# Patient Record
Sex: Female | Born: 1985 | Race: White | Hispanic: No | Marital: Single | Smoking: Current every day smoker
Health system: Southern US, Community
[De-identification: ages and names within clinical notes are randomized; demographics above are authoritative.]

## PROBLEM LIST (undated history)

## (undated) DIAGNOSIS — Z5189 Encounter for other specified aftercare: Secondary | ICD-10-CM

## (undated) DIAGNOSIS — J189 Pneumonia, unspecified organism: Secondary | ICD-10-CM

## (undated) DIAGNOSIS — IMO0001 Reserved for inherently not codable concepts without codable children: Secondary | ICD-10-CM

## (undated) DIAGNOSIS — R7303 Prediabetes: Secondary | ICD-10-CM

## (undated) DIAGNOSIS — K760 Fatty (change of) liver, not elsewhere classified: Secondary | ICD-10-CM

## (undated) DIAGNOSIS — E7889 Other lipoprotein metabolism disorders: Secondary | ICD-10-CM

## (undated) DIAGNOSIS — D649 Anemia, unspecified: Secondary | ICD-10-CM

## (undated) DIAGNOSIS — R740 Nonspecific elevation of levels of transaminase and lactic acid dehydrogenase [LDH]: Secondary | ICD-10-CM

## (undated) DIAGNOSIS — F909 Attention-deficit hyperactivity disorder, unspecified type: Secondary | ICD-10-CM

## (undated) HISTORY — DX: Pneumonia, unspecified organism: J18.9

## (undated) HISTORY — DX: Anemia, unspecified: D64.9

## (undated) HISTORY — DX: Reserved for inherently not codable concepts without codable children: IMO0001

## (undated) HISTORY — DX: Prediabetes: R73.03

## (undated) HISTORY — DX: Other lipoprotein metabolism disorders: E78.89

## (undated) HISTORY — DX: Nonspecific elevation of levels of transaminase and lactic acid dehydrogenase (ldh): R74.0

## (undated) HISTORY — DX: Encounter for other specified aftercare: Z51.89

## (undated) HISTORY — DX: Fatty (change of) liver, not elsewhere classified: K76.0

## (undated) HISTORY — DX: Attention-deficit hyperactivity disorder, unspecified type: F90.9

---

## 2008-08-11 ENCOUNTER — Ambulatory Visit: Payer: Self-pay | Admitting: Family Medicine

## 2008-12-06 ENCOUNTER — Ambulatory Visit: Payer: Self-pay | Admitting: Family Medicine

## 2008-12-19 ENCOUNTER — Ambulatory Visit: Payer: Self-pay | Admitting: Family Medicine

## 2011-05-04 ENCOUNTER — Inpatient Hospital Stay (HOSPITAL_COMMUNITY)
Admission: EM | Admit: 2011-05-04 | Discharge: 2011-05-06 | DRG: 760 | Disposition: A | Discharge status: Home / Self Care | Payer: Self-pay | Attending: Family Medicine | Admitting: Family Medicine

## 2011-05-04 DIAGNOSIS — D62 Acute posthemorrhagic anemia: Secondary | ICD-10-CM | POA: Diagnosis present

## 2011-05-04 DIAGNOSIS — Y9210 Unspecified residential institution as the place of occurrence of the external cause: Secondary | ICD-10-CM | POA: Diagnosis not present

## 2011-05-04 DIAGNOSIS — L408 Other psoriasis: Secondary | ICD-10-CM | POA: Diagnosis present

## 2011-05-04 DIAGNOSIS — J45909 Unspecified asthma, uncomplicated: Secondary | ICD-10-CM | POA: Diagnosis present

## 2011-05-04 DIAGNOSIS — N926 Irregular menstruation, unspecified: Secondary | ICD-10-CM | POA: Diagnosis present

## 2011-05-04 DIAGNOSIS — Y849 Medical procedure, unspecified as the cause of abnormal reaction of the patient, or of later complication, without mention of misadventure at the time of the procedure: Secondary | ICD-10-CM | POA: Diagnosis not present

## 2011-05-04 DIAGNOSIS — F17200 Nicotine dependence, unspecified, uncomplicated: Secondary | ICD-10-CM | POA: Diagnosis present

## 2011-05-04 DIAGNOSIS — N920 Excessive and frequent menstruation with regular cycle: Principal | ICD-10-CM | POA: Diagnosis present

## 2011-05-04 DIAGNOSIS — N921 Excessive and frequent menstruation with irregular cycle: Secondary | ICD-10-CM

## 2011-05-04 DIAGNOSIS — K259 Gastric ulcer, unspecified as acute or chronic, without hemorrhage or perforation: Secondary | ICD-10-CM | POA: Diagnosis present

## 2011-05-04 DIAGNOSIS — R5084 Febrile nonhemolytic transfusion reaction: Secondary | ICD-10-CM | POA: Diagnosis not present

## 2011-05-04 LAB — WET PREP, GENITAL
Clue Cells Wet Prep HPF POC: NONE SEEN
Trich, Wet Prep: NONE SEEN

## 2011-05-04 LAB — DIFFERENTIAL
Basophils Absolute: 0 10*3/uL (ref 0.0–0.1)
Eosinophils Absolute: 0.2 10*3/uL (ref 0.0–0.7)
Eosinophils Relative: 1 % (ref 0–5)
Lymphocytes Relative: 15 % (ref 12–46)
Neutrophils Relative %: 79 % — ABNORMAL HIGH (ref 43–77)

## 2011-05-04 LAB — POCT PREGNANCY, URINE: Preg Test, Ur: NEGATIVE

## 2011-05-04 LAB — BASIC METABOLIC PANEL
GFR calc non Af Amer: >60 mL/min (ref >60)
Potassium: 3.3 mEq/L — ABNORMAL LOW (ref 3.5–5.1)
Sodium: 139 mEq/L (ref 135–145)

## 2011-05-04 LAB — CBC
HCT: 16.5 % — ABNORMAL LOW (ref 36.0–46.0)
Platelets: 270 10*3/uL (ref 150–400)
RBC: 2.54 MIL/uL — ABNORMAL LOW (ref 3.87–5.11)
RDW: 17.7 % — ABNORMAL HIGH (ref 11.5–15.5)
WBC: 11.8 10*3/uL — ABNORMAL HIGH (ref 4.0–10.5)

## 2011-05-04 LAB — URINALYSIS, ROUTINE W REFLEX MICROSCOPIC
Ketones, ur: NEGATIVE mg/dL
Leukocytes, UA: NEGATIVE
Nitrite: NEGATIVE
pH: 5.5 (ref 5.0–8.0)

## 2011-05-04 LAB — URINE MICROSCOPIC-ADD ON

## 2011-05-04 LAB — ABO/RH: ABO/RH(D): A NEG

## 2011-05-05 ENCOUNTER — Inpatient Hospital Stay (HOSPITAL_COMMUNITY): Payer: Self-pay

## 2011-05-05 LAB — URINALYSIS, MICROSCOPIC ONLY
Glucose, UA: NEGATIVE mg/dL
Protein, ur: 100 mg/dL — AB
Specific Gravity, Urine: 1.011 (ref 1.005–1.030)
pH: 6 (ref 5.0–8.0)

## 2011-05-05 LAB — CBC
MCH: 23.8 pg — ABNORMAL LOW (ref 26.0–34.0)
MCH: 25.5 pg — ABNORMAL LOW (ref 26.0–34.0)
MCHC: 32.8 g/dL (ref 30.0–36.0)
MCHC: 33.9 g/dL (ref 30.0–36.0)
MCV: 72.5 fL — ABNORMAL LOW (ref 78.0–100.0)
MCV: 75.2 fL — ABNORMAL LOW (ref 78.0–100.0)
Platelets: 260 10*3/uL (ref 150–400)
Platelets: 273 10*3/uL (ref 150–400)
RDW: 23.2 % — ABNORMAL HIGH (ref 11.5–15.5)

## 2011-05-05 LAB — BASIC METABOLIC PANEL
BUN: 5 mg/dL — ABNORMAL LOW (ref 6–23)
Creatinine, Ser: 0.68 mg/dL (ref 0.4–1.2)
GFR calc non Af Amer: >60 mL/min (ref >60)
Glucose, Bld: 168 mg/dL — ABNORMAL HIGH (ref 70–99)

## 2011-05-05 LAB — PROLACTIN: Prolactin: 8.8 ng/mL

## 2011-05-06 ENCOUNTER — Inpatient Hospital Stay (HOSPITAL_COMMUNITY): Payer: Self-pay

## 2011-05-06 LAB — CROSSMATCH
Unit division: 0
Unit division: 0

## 2011-05-07 LAB — TRANSFUSION REACTION: Post RXN DAT IgG: NEGATIVE

## 2011-05-07 LAB — GC/CHLAMYDIA PROBE AMP, GENITAL: GC Probe Amp, Genital: NEGATIVE

## 2011-05-20 NOTE — Discharge Summary (Signed)
Ana [name], Ana [name]               ACCOUNT NO.:  [id number]  MEDICAL RECORD NO.:  [id number]           PATIENT TYPE:  I  LOCATION:  5121                         FACILITY:  [facility]  PHYSICIAN:  Emila Steinhauser A. [name], M.D.    DATE OF BIRTH:  [birth year]  DATE OF ADMISSION:  05/04/2011 DATE OF DISCHARGE:  05/06/2011                              DISCHARGE SUMMARY   PRIMARY CARE PROVIDER:  Gentry Fitz.  DISCHARGE DIAGNOSES: 1. Metromenorrhagia. 2. Blood loss anemia. 3. Irregular menstrual periods. 4. Gastric ulcer. 5. Psoriasis.  DISCHARGE MEDICATIONS: 1. Iron sulfate 35 mg 1 tablet by mouth three times a day. 2. Ibuprofen 800 mg 1 tablet by mouth every 8 hours as needed for     pain. 3. Provera 10 mg 1 tablet by mouth daily for the next 8 days. 4. Multivitamins 1 tablet by mouth daily.  CONSULTS:  None.  PROCEDURES:  Transvaginal ultrasound:  Mildly prominent endometrium with internal homogeneity.  This may indicate hyperplasia, neoplasia, or less likely polyps or submucosal fibroid.  This was while the patient was on progesterone.  Repeat imaging after cessation withdrawal bleeding may be helpful to assess for interval change and to better evaluate the endometrium.  LABORATORY DATA: 1. CBC on admission:  WBC 11.8, hemoglobin 4.9, hematocrit 16.5,     platelets 270. 2. BMET on admission:  Sodium 139, potassium 3.3, chloride 106, CO2     44, BUN 8, creatinine 0.69, glucose 135. 3. Urine pregnancy negative. 4. Gonorrhea and Chlamydia pending. 5. Wet prep negative. 6. UA negative. 7. Prolactin level 8.8. 8. TSH 0.911. 9. CBC on May 05, 2011:  WBC 19.1, hemoglobin 7.6, hematocrit 22.4,     platelets 273.  BRIEF HOSPITAL COURSE:  The patient is a 25 year old female admitted for profuse vaginal bleeding and found to have anemia. 1. Metromenorrhagia:  The patient has a history of irregular menstrual     cycles.  The patient was having heavy vaginal bleeding for 3-4     days.  Prior  to that, she had not had a real period in 2-3 years.     The patient had a transvaginal ultrasound to assess the source of     the bleeding and this showed a mildly prominent endometrium likely     due to hyperplasia, although neoplasm was also listed on the     differential.  This recommended repeat ultrasound after cessation     of withdrawal bleeding.  The patient was started on IV estrogen x1     and also on Provera for 10 days.  After admission, the patient's     vaginal bleeding had stopped the day after admission.  At the time     of discharge, the patient had not had any vaginal bleeding for 24     hours. 2. Anemia:  This was due to blood loss anemia from her heavy vaginal     bleeding.  On admission, her hemoglobin was 4.9.  The patient did     receive 3 units of packed red blood cells.  The patient had a     transfusion reaction with  a fever, but she did receive the     transfusions.  After the blood, her hemoglobin responded     appropriately and was 7.9 prior to discharge.  The patient was also     started on oral iron which she will continue.  The patient should     continue the Provera for completion of 10-day course.  The patient     likely has too much estrogen and could benefit from cyclic     progesterone but we will defer this to primary care provider.  FOLLOWUP:  The patient is to follow up with Dr. Edmonia James at [facility], phone number [phone].  The patient is to call and schedule an appointment for next week.  DISCHARGE INSTRUCTIONS:  The patient is to increase her activity slowly. The patient is to have a low-sodium, heart-healthy diet.  The patient should be monitored for the next 48 hours.  DISCHARGE CONDITION:  Stable.     Ana Sole, MD   ______________________________ Ana [name]. [name], M.D.    WS/MEDQ  D:  05/06/2011  T:  05/06/2011  Job:  [id number]  Electronically Signed by Ana Sole MD on 05/14/2011 02:16:43  PM Electronically Signed by Zachery Dauer M.D. on 05/20/2011 07:21:20 AM

## 2011-05-20 NOTE — H&P (Signed)
[name], [name]               ACCOUNT NO.:  [id number]  MEDICAL RECORD NO.:  [id number]           PATIENT TYPE:  I  LOCATION:  5121                         FACILITY:  [facility]  PHYSICIAN:  Jaeson Molstad A. [name], M.D.    DATE OF BIRTH:  06-08-1986  DATE OF ADMISSION:  05/04/2011 DATE OF DISCHARGE:                             HISTORY & PHYSICAL   PRIMARY CARE PHYSICIAN:  Unassigned.  CHIEF COMPLAINT:  Vaginal bleeding, anemia, and syncope.  The patient is a 25 year old female who presents with 3-4 days of vaginal bleeding and one episode of syncope today.  The patient states that she has not had a period in 2-3 years, only occasional spotting every 2-3 months that lasts for 1 day.  The patient again had bleeding and no abdominal cramping since Wednesday, large clots per the patient, had to use greater than 30 tampons secondary to heavy bleeding today. The patient also got in hot shower and had syncopal episodes.  The patient's partner assisted that she come to the emergency department for evaluation.  On arrival, hemoglobin found to be 4.9.  The patient has a long history of anemia.  ALLERGIES:  No known drug allergies.  MEDICATIONS:  On iron supplement b.i.d. x2 days.  PAST MEDICAL HISTORY: 1. Gastric ulcer. 2. Irregular menstrual periods. 3. Psoriasis. 4. Anemia.  PAST SURGICAL HISTORY:  None.  SOCIAL HISTORY:  The patient lives with partner.  She works as an Educational psychologist.  She smokes 1-2 cigarettes per day x6 years.  She drinks occasional alcohol and denies drug use.  FAMILY HISTORY:  Mother has thyroid problems.  Father DVT.  Siblings ADD.  REVIEW OF SYSTEMS:  Positive for chills, positive decrease in appetite, positive fatigue, positive headache, positive chest pain, positive dyspnea, positive bleeding, positive dizziness.  No fevers, no sweats, no sore throat, no edema, no wheezing, no cough, no nausea, no vomiting, no diarrhea, no dysuria, no rash, no myalgias, no  polyuria.  PHYSICAL EXAM:  VITAL SIGNS:  Temperature 99.9, pulse 114, respiratory rate 26, blood pressure 126/58, pulse ox 100% on room air. GENERAL:  In no acute distress, resting in bed. HEENT:  Pale complexion.  Mucous membranes moist.  Normocephalic, atraumatic. CARDIOVASCULAR:  Regular rate and rhythm.  No murmurs, rubs, or gallops. LUNGS:  Clear to auscultation.  Bilateral good air movement. ABDOMEN:  Soft and nontender.  No rebound, no guarding.  Positive tenderness in the lower abdomen. GU:  Exam by ER MD, please see note.  External exam normal.  Continued vaginal bleeding. EXTREMITIES:  No lower extremity edema.  Hands are little bit pale in color.  Cap refill less than 3 seconds. NEURO:  Alert and oriented x3.  Following all commands.  Cranial nerves II-XII grossly intact. MSK:  Moving all four extremities.  LABORATORIES AND STUDIES:  EKG showed normal sinus tach.  UA showed trace hemoglobin.  Yeast, Trichomonas, clue cells negative.  Urine pregnancy negative.  Sodium 149, potassium 2.3.  White blood cells 11.8, hemoglobin 4.9, MCV 65.  PROBLEMS: 1. Metromenorrhagia, hemoglobin 4.9, originally ordered total of 4     packed red blood cell  units, but secondary to fever from blood     transfusion, I have spoken with MD for blood bank, and he     recommends only 1 more unit for now with post monitoring of the     patient.  The patient given Benadryl and Solu-Medrol for further     prevention of allergic response.  Gave the patient IV estrogen.     Birth control pill in house.  Vicodin given for abdominal cramping. 2. Anemia.  The patient states she has a chronic history of anemia,     now worsened with acute loss.  We will continue iron supplement.     Hemoglobin currently 4.9.  We will recheck after unit #2 has been     administered. 3. Abdominal pain, minimal lower abdominal pain present times 3-4     days.  Vicodin p.r.n.  Urine pregnancy test negative.  No point      tenderness. 4. Disposition, pending clinical improvement.     Ellin Mayhew, MD   ______________________________ [name]. [name], M.D.    DC/MEDQ  D:  05/05/2011  T:  05/05/2011  Job:  [id number]  Electronically Signed by Ellin Mayhew  on 05/13/2011 10:01:42 AM Electronically Signed by Zachery Dauer M.D. on 05/20/2011 07:20:36 AM

## 2011-11-26 ENCOUNTER — Emergency Department (HOSPITAL_COMMUNITY): Payer: Self-pay

## 2011-11-26 ENCOUNTER — Emergency Department (HOSPITAL_COMMUNITY)
Admission: EM | Admit: 2011-11-26 | Discharge: 2011-11-26 | Disposition: A | Discharge status: Home / Self Care | Payer: Self-pay | Attending: Emergency Medicine | Admitting: Emergency Medicine

## 2011-11-26 DIAGNOSIS — R51 Headache: Secondary | ICD-10-CM | POA: Insufficient documentation

## 2011-11-26 DIAGNOSIS — N920 Excessive and frequent menstruation with regular cycle: Secondary | ICD-10-CM | POA: Insufficient documentation

## 2011-11-26 DIAGNOSIS — N898 Other specified noninflammatory disorders of vagina: Secondary | ICD-10-CM | POA: Insufficient documentation

## 2011-11-26 DIAGNOSIS — R42 Dizziness and giddiness: Secondary | ICD-10-CM | POA: Insufficient documentation

## 2011-11-26 DIAGNOSIS — R109 Unspecified abdominal pain: Secondary | ICD-10-CM | POA: Insufficient documentation

## 2011-11-26 DIAGNOSIS — N926 Irregular menstruation, unspecified: Secondary | ICD-10-CM | POA: Insufficient documentation

## 2011-11-26 DIAGNOSIS — N921 Excessive and frequent menstruation with irregular cycle: Secondary | ICD-10-CM

## 2011-11-26 LAB — POCT I-STAT, CHEM 8
HCT: 39 % (ref 36.0–46.0)
Hemoglobin: 13.3 g/dL (ref 12.0–15.0)
Potassium: 4.3 mEq/L (ref 3.5–5.1)
Sodium: 144 mEq/L (ref 135–145)

## 2011-11-26 LAB — DIFFERENTIAL
Basophils Absolute: 0 10*3/uL (ref 0.0–0.1)
Basophils Relative: 0 % (ref 0–1)
Eosinophils Absolute: 0.1 10*3/uL (ref 0.0–0.7)
Neutrophils Relative %: 70 % (ref 43–77)

## 2011-11-26 LAB — WET PREP, GENITAL
Clue Cells Wet Prep HPF POC: NONE SEEN
Trich, Wet Prep: NONE SEEN

## 2011-11-26 LAB — URINALYSIS, ROUTINE W REFLEX MICROSCOPIC
Bilirubin Urine: NEGATIVE
Specific Gravity, Urine: 1.017 (ref 1.005–1.030)
Urobilinogen, UA: 0.2 mg/dL (ref 0.0–1.0)

## 2011-11-26 LAB — CBC
MCH: 27.7 pg (ref 26.0–34.0)
MCHC: 34.5 g/dL (ref 30.0–36.0)
Platelets: 260 10*3/uL (ref 150–400)
RBC: 4.65 MIL/uL (ref 3.87–5.11)
RDW: 13.6 % (ref 11.5–15.5)

## 2011-11-26 LAB — URINE MICROSCOPIC-ADD ON

## 2011-11-26 MED ORDER — MEDROXYPROGESTERONE ACETATE 5 MG PO TABS: 10.0000 mg | ORAL_TABLET | Freq: Every day | ORAL | Status: DC

## 2011-11-26 MED ORDER — ONDANSETRON HCL 4 MG/2ML IJ SOLN
4.0000 mg | Freq: Once | INTRAMUSCULAR | Status: AC
  Administered 2011-11-26: 4 mg via INTRAVENOUS
  Filled 2011-11-26: qty 2

## 2011-11-26 MED ORDER — IBUPROFEN 600 MG PO TABS: 600.0000 mg | ORAL_TABLET | Freq: Four times a day (QID) | ORAL | Status: AC | PRN

## 2011-11-26 MED ORDER — MEDROXYPROGESTERONE ACETATE 5 MG PO TABS: 5.0000 mg | ORAL_TABLET | Freq: Every day | ORAL | Status: DC

## 2011-11-26 MED ORDER — SODIUM CHLORIDE 0.9 % IV BOLUS (SEPSIS)
1000.0000 mL | Freq: Once | INTRAVENOUS | Status: AC
  Administered 2011-11-26 (×2): 1000 mL via INTRAVENOUS

## 2011-11-26 MED ORDER — MORPHINE SULFATE 4 MG/ML IJ SOLN
4.0000 mg | Freq: Once | INTRAMUSCULAR | Status: AC
  Administered 2011-11-26: 4 mg via INTRAVENOUS
  Filled 2011-11-26: qty 1

## 2011-11-26 NOTE — ED Provider Notes (Signed)
History    this is a 25 year old female presenting to the ED with chief complaints of lower abd pain and abnormal vaginal bleeding. For the past 4 days patient has been having increasing abdominal pain and vaginal bleeding. Initially she noticed one pad equivalent of vaginal bleeding without clots on the first day. Bleeding continue to persist and increase for the next several days. Yesterday she has more than 10 pads of vaginal bleeding with large amount of clots. She describes the lower abd pain is sharp, worsening with movement or bending over.  She also complaining of headache, lightheadedness and dizziness. She denies chest pain, shortness of breath, dysuria, vaginal discharge, rash. She has at home we'll menstruation and does not recall when was the last menstrual period. She is not sexually active. She has had similar pain in the past and was found to be anemic, which require blood transfusion.  CSN: [id number] Arrival date & time: 11/26/2011  8:19 AM   First MD Initiated Contact with Patient 11/26/11 0930      Chief Complaint  Patient presents with  . Vaginal Bleeding    (Consider location/radiation/quality/duration/timing/severity/associated sxs/prior treatment) HPI  History reviewed. No pertinent past medical history.  History reviewed. No pertinent past surgical history.  No family history on file.  History  Substance Use Topics  . Smoking status: Current Everyday Smoker  . Smokeless tobacco: Not on file  . Alcohol Use: No    OB History    Grav Para Term Preterm Abortions TAB SAB Ect Mult Living                  Review of Systems  All other systems reviewed and are negative.    Allergies  Review of patient's allergies indicates no known allergies.  Home Medications   Current Outpatient Rx  Name Route Sig Dispense Refill  . FERROUS SULFATE 325 (65 FE) MG PO TABS Oral Take 325 mg by mouth 2 (two) times daily.      . IBUPROFEN 200 MG PO TABS Oral Take 600 mg  by mouth 2 (two) times daily as needed. For pain       BP 134/72  Pulse 115  Temp(Src) 98.9 F (37.2 C) (Oral)  Resp 16  Ht 5\' 6"  (1.676 m)  Wt 210 lb (95.255 kg)  BMI 33.89 kg/m2  SpO2 98%  LMP 11/22/2011  Physical Exam  Nursing note and vitals reviewed. Constitutional: She appears well-developed and well-nourished. No distress.  HENT:  Head: Normocephalic and atraumatic.  Eyes: Conjunctivae are normal.  Neck: Normal range of motion. Neck supple.  Cardiovascular: Normal rate and regular rhythm.   Pulmonary/Chest: Effort normal and breath sounds normal. She exhibits no tenderness.  Abdominal: Soft. There is no tenderness. Hernia confirmed negative in the right inguinal area and confirmed negative in the left inguinal area.  Genitourinary: Uterus normal. There is no rash or lesion on the right labia. There is no rash or lesion on the left labia. Uterus is not tender. Cervix exhibits no motion tenderness and no discharge. Right adnexum displays no mass and no tenderness. Left adnexum displays no mass and no tenderness. There is bleeding around the vagina. No erythema or tenderness around the vagina. No vaginal discharge found.  Lymphadenopathy:       Right: No inguinal adenopathy present.       Left: No inguinal adenopathy present.    ED Course  Procedures (including critical care time)  Labs Reviewed - No data to display No results  found.   No diagnosis found.    MDM  Lower abnormal pain with vaginal bleeding, suggestive of ovarian cyst.  1:00 PM Lab reveals no evidence of anemia, uti, or other signs of infection. Electrolytes within normal limits. Ultrasounds revealed no acute finding. Her pain has improved. Patient request for progesterin to help decrease bleeding and pain. Patient state it has helped her in the past. She does not have an OB/GYN. I recommend for her to follow up with [facility]. Understanding and agreed with plan. I do not suspect other infection  or appendicitis as a source of problems.      Fayrene Helper, PA 11/26/11 [phone]

## 2011-11-26 NOTE — ED Notes (Signed)
Vaginal bleeding began on Friday.  Pt. Reports it being heavy and having pain

## 2011-11-26 NOTE — ED Notes (Signed)
Transported to u/s

## 2011-11-26 NOTE — ED Notes (Signed)
Pt  States she has profuse vaginal bleeding that started yesterday.  Has used a whole box of tampon since last night.   C/o " a little dizzy" associated with her abdominal pain.

## 2011-11-27 LAB — GC/CHLAMYDIA PROBE AMP, GENITAL
Chlamydia, DNA Probe: NEGATIVE
GC Probe Amp, Genital: NEGATIVE

## 2011-11-29 NOTE — ED Provider Notes (Signed)
Evaluation and management procedures were performed by the PA/NP under my supervision/collaboration.    Felisa Bonier, MD 11/29/11 [phone]

## 2012-07-06 ENCOUNTER — Ambulatory Visit (INDEPENDENT_AMBULATORY_CARE_PROVIDER_SITE_OTHER): Payer: BC Managed Care – PPO | Admitting: Obstetrics & Gynecology

## 2012-07-06 ENCOUNTER — Encounter: Payer: Self-pay | Admitting: Obstetrics & Gynecology

## 2012-07-06 VITALS — BP 123/76 | HR 88 | Temp 98.4°F | Resp 12 | Ht 66.0 in | Wt 229.0 lb

## 2012-07-06 DIAGNOSIS — E282 Polycystic ovarian syndrome: Secondary | ICD-10-CM

## 2012-07-06 DIAGNOSIS — N920 Excessive and frequent menstruation with regular cycle: Secondary | ICD-10-CM

## 2012-07-06 DIAGNOSIS — Z01419 Encounter for gynecological examination (general) (routine) without abnormal findings: Secondary | ICD-10-CM

## 2012-07-06 LAB — HEMOGLOBIN A1C
Hgb A1c MFr Bld: 6 % — ABNORMAL HIGH (ref <5.7)
Mean Plasma Glucose: 126 mg/dL — ABNORMAL HIGH (ref <117)

## 2012-07-06 LAB — CBC
Hemoglobin: 11.6 g/dL — ABNORMAL LOW (ref 12.0–15.0)
MCHC: 32.1 g/dL (ref 30.0–36.0)
Platelets: 313 10*3/uL (ref 150–400)
RBC: 5.41 MIL/uL — ABNORMAL HIGH (ref 3.87–5.11)

## 2012-07-06 LAB — LIPID PANEL: LDL Cholesterol: 76 mg/dL (ref 0–99)

## 2012-07-06 MED ORDER — MEDROXYPROGESTERONE ACETATE 10 MG PO TABS: 10.0000 mg | ORAL_TABLET | Freq: Every day | ORAL | Status: DC

## 2012-07-06 NOTE — Patient Instructions (Signed)
Polycystic Ovarian Syndrome Polycystic ovarian syndrome is a condition with a number of problems. One problem is with the ovaries. The ovaries are organs located in the female pelvis, on each side of the uterus. Usually, during the menstrual cycle, an egg is released from 1 ovary every month. This is called ovulation. When the egg is fertilized, it goes into the womb (uterus), which allows for the growth of a baby. The egg travels from the ovary through the fallopian tube to the uterus. The ovaries also make the hormones estrogen and progesterone. These hormones help the development of a woman's breasts, body shape, and body hair. They also regulate the menstrual cycle and pregnancy. Sometimes, cysts form in the ovaries. A cyst is a fluid-filled sac. On the ovary, different types of cysts can form. The most common type of ovarian cyst is called a functional or ovulation cyst. It is normal, and often forms during the normal menstrual cycle. Each month, a woman's ovaries grow tiny cysts that hold the eggs. When an egg is fully grown, the sac breaks open. This releases the egg. Then, the sac which released the egg from the ovary dissolves. In one type of functional cyst, called a follicle cyst, the sac does not break open to release the egg. It may actually continue to grow. This type of cyst usually disappears within 1 to 3 months.  One type of cyst problem with the ovaries is called Polycystic Ovarian Syndrome (PCOS). In this condition, many follicle cysts form, but do not rupture and produce an egg. This health problem can affect the following:  Menstrual cycle.   Heart.   Obesity.   Cancer of the uterus.   Fertility.   Blood vessels.   Hair growth (face and body) or baldness.   Hormones.   Appearance.   High blood pressure.   Stroke.   Insulin production.   Inflammation of the liver.   Elevated blood cholesterol and triglycerides.  CAUSES   No one knows the exact cause of PCOS.    Women with PCOS often have a mother or sister with PCOS. There is not yet enough proof to say this is inherited.   Many women with PCOS have a weight problem.   Researchers are looking at the relationship between PCOS and the body's ability to make insulin. Insulin is a hormone that regulates the change of sugar, starches, and other food into energy for the body's use, or for storage. Some women with PCOS make too much insulin. It is possible that the ovaries react by making too many female hormones, called androgens. This can lead to acne, excessive hair growth, weight gain, and ovulation problems.   Too much production of luteinizing hormone (LH) from the pituitary gland in the brain stimulates the ovary to produce too much female hormone (androgen).  SYMPTOMS   Infrequent or no menstrual periods, and/or irregular bleeding.   Inability to get pregnant (infertility), because of not ovulating.   Increased growth of hair on the face, chest, stomach, back, thumbs, thighs, or toes.   Acne, oily skin, or dandruff.   Pelvic pain.   Weight gain or obesity, usually carrying extra weight around the waist.   Type 2 diabetes (this is the diabetes that usually does not need insulin).   High cholesterol.   High blood pressure.   Female-pattern baldness or thinning hair.   Patches of thickened and dark brown or black skin on the neck, arms, breasts, or thighs.   Skin tags,   or tiny excess flaps of skin, in the armpits or neck area.   Sleep apnea (excessive snoring and breathing stops at times while asleep).   Deepening of the voice.   Gestational diabetes when pregnant.   Increased risk of miscarriage with pregnancy.  DIAGNOSIS  There is no single test to diagnose PCOS.   Your caregiver will:   Take a medical history.   Perform a pelvic exam.   Perform an ultrasound.   Check your female and female hormone levels.   Measure glucose or sugar levels in the blood.   Do other blood  tests.   If you are producing too many female hormones, your caregiver will make sure it is from PCOS. At the physical exam, your caregiver will want to evaluate the areas of increased hair growth. Try to allow natural hair growth for a few days before the visit.   During a pelvic exam, the ovaries may be enlarged or swollen by the increased number of small cysts. This can be seen more easily by vaginal ultrasound or screening, to examine the ovaries and lining of the uterus (endometrium) for cysts. The uterine lining may become thicker, if there has not been a regular period.  TREATMENT  Because there is no cure for PCOS, it needs to be managed to prevent problems. Treatments are based on your symptoms. Treatment is also based on whether you want to have a baby or whether you need contraception.  Treatment may include:  Progesterone hormone, to start a menstrual period.   Birth control pills, to make you have regular menstrual periods.   Medicines to make you ovulate, if you want to get pregnant.   Medicines to control your insulin.   Medicine to control your blood pressure.   Medicine and diet, to control your high cholesterol and triglycerides in your blood.   Surgery, making small holes in the ovary, to decrease the amount of female hormone production. This is done through a long, lighted tube (laparoscope), placed into the pelvis through a tiny incision in the lower abdomen.  Your caregiver will go over some of the choices with you. WOMEN WITH PCOS HAVE THESE CHARACTERISTICS:  High levels of female hormones called androgens.   An irregular or no menstrual cycle.   May have many small cysts in their ovaries.  PCOS is the most common hormonal reproductive problem in women of childbearing age. WHY DO WOMEN WITH PCOS HAVE TROUBLE WITH THEIR MENSTRUAL CYCLE? Each month, about 20 eggs start to mature in the ovaries. As one egg grows and matures, the follicle breaks open to release the egg,  so it can travel through the fallopian tube for fertilization. When the single egg leaves the follicle, ovulation takes place. In women with PCOS, the ovary does not make all of the hormones it needs for any of the eggs to fully mature. They may start to grow and accumulate fluid, but no one egg becomes large enough. Instead, some may remain as cysts. Since no egg matures or is released, ovulation does not occur and the hormone progesterone is not made. Without progesterone, a woman's menstrual cycle is irregular or absent. Also, the cysts produce female hormones, which continue to prevent ovulation.  Document Released: 03/21/2005 Document Revised: 11/14/2011 Document Reviewed: 10/13/2009 [facility] Patient Information 2012 [place], [place].Levonorgestrel intrauterine device (IUD) What is this medicine? LEVONORGESTREL IUD (LEE voe nor jes trel) is a contraceptive (birth control) device. It is used to prevent pregnancy and to treat heavy bleeding  that occurs during your period. It can be used for up to 5 years. This medicine may be used for other purposes; ask your health care provider or pharmacist if you have questions. What should I tell my health care provider before I take this medicine? They need to know if you have any of these conditions: -abnormal Pap smear -cancer of the breast, uterus, or cervix -diabetes -endometritis -genital or pelvic infection now or in the past -have more than one sexual partner or your partner has more than one partner -heart disease -history of an ectopic or tubal pregnancy -immune system problems -IUD in place -liver disease or tumor -problems with blood clots or take blood-thinners -use intravenous drugs -uterus of unusual shape -vaginal bleeding that has not been explained -an unusual or allergic reaction to levonorgestrel, other hormones, silicone, or polyethylene, medicines, foods, dyes, or preservatives -pregnant or trying to get  pregnant -breast-feeding How should I use this medicine? This device is placed inside the uterus by a health care professional. Talk to your pediatrician regarding the use of this medicine in children. Special care may be needed. Overdosage: If you think you have taken too much of this medicine contact a poison control center or emergency room at once. NOTE: This medicine is only for you. Do not share this medicine with others. What if I miss a dose? This does not apply. What may interact with this medicine? Do not take this medicine with any of the following medications: -amprenavir -bosentan -fosamprenavir This medicine may also interact with the following medications: -aprepitant -barbiturate medicines for inducing sleep or treating seizures -bexarotene -griseofulvin -medicines to treat seizures like carbamazepine, ethotoin, felbamate, oxcarbazepine, phenytoin, topiramate -modafinil -pioglitazone -rifabutin -rifampin -rifapentine -some medicines to treat HIV infection like atazanavir, indinavir, lopinavir, nelfinavir, tipranavir, ritonavir -St. John's wort -warfarin This list may not describe all possible interactions. Give your health care provider a list of all the medicines, herbs, non-prescription drugs, or dietary supplements you use. Also tell them if you smoke, drink alcohol, or use illegal drugs. Some items may interact with your medicine. What should I watch for while using this medicine? Visit your doctor or health care professional for regular check ups. See your doctor if you or your partner has sexual contact with others, becomes HIV positive, or gets a sexual transmitted disease. This product does not protect you against HIV infection (AIDS) or other sexually transmitted diseases. You can check the placement of the IUD yourself by reaching up to the top of your vagina with clean fingers to feel the threads. Do not pull on the threads. It is a good habit to check  placement after each menstrual period. Call your doctor right away if you feel more of the IUD than just the threads or if you cannot feel the threads at all. The IUD may come out by itself. You may become pregnant if the device comes out. If you notice that the IUD has come out use a backup birth control method like condoms and call your health care provider. Using tampons will not change the position of the IUD and are okay to use during your period. What side effects may I notice from receiving this medicine? Side effects that you should report to your doctor or health care professional as soon as possible: -allergic reactions like skin rash, itching or hives, swelling of the face, lips, or tongue -fever, flu-like symptoms -genital sores -high blood pressure -no menstrual period for 6 weeks during use -pain, swelling,  warmth in the leg -pelvic pain or tenderness -severe or sudden headache -signs of pregnancy -stomach cramping -sudden shortness of breath -trouble with balance, talking, or walking -unusual vaginal bleeding, discharge -yellowing of the eyes or skin Side effects that usually do not require medical attention (report to your doctor or health care professional if they continue or are bothersome): -acne -breast pain -change in sex drive or performance -changes in weight -cramping, dizziness, or faintness while the device is being inserted -headache -irregular menstrual bleeding within first 3 to 6 months of use -nausea This list may not describe all possible side effects. Call your doctor for medical advice about side effects. You may report side effects to FDA at 1-800-FDA-1088. Where should I keep my medicine? This does not apply. NOTE: This sheet is a summary. It may not cover all possible information. If you have questions about this medicine, talk to your doctor, pharmacist, or health care provider.  2012, [employer]. (12/16/2008 6:39:08 PM)

## 2012-07-06 NOTE — Progress Notes (Signed)
Patient ID: Ana [name], female   DOB: 1986/05/22, 26 y.o.   MRN: [id number]  Patient states she usually has 2 periods per calendar year. She has been hospitalized or had to be evaluated in the emergency department for each period last year. Has always had irregular, heavy periods with clots. Periods last longer than a week. Was on OCPs at a younger age, but cannot tolerate OCPs due to severe pain. Was given provera in May and November of 2012 for a 10 day course each time which stopped the bleeding for a 6 month time period.

## 2012-07-06 NOTE — Progress Notes (Signed)
Patient ID: Ana [name], female   DOB: [birth year], 26 y.o.   MRN: [id number]  Chief Complaint  Patient presents with  . Menometrorrhagia    HPI Ana [name] is a 26 y.o. female.  G0 HPI Pt with long time h/o irreg cycles.  Never had ovulatory cycles.  Had 2 episodes of very heavy bleeding 1 requiring transfusion.  Pt frustrated with lack of sx relief.   Past Medical History  Diagnosis Date  . Anemia   . Blood transfusion     May 2012    History reviewed. No pertinent past surgical history.  Family History  Problem Relation Age of Onset  . Hypertension Paternal Aunt   . Diabetes Maternal Grandmother     Social History History  Substance Use Topics  . Smoking status: Current Everyday Smoker -- 0.2 packs/day for 3 years    Types: Cigarettes  . Smokeless tobacco: Not on file  . Alcohol Use: No    No Known Allergies  Current Outpatient Prescriptions  Medication Sig Dispense Refill  . ferrous sulfate 325 (65 FE) MG tablet Take 325 mg by mouth 2 (two) times daily.        Marland Kitchen ibuprofen (ADVIL,MOTRIN) 200 MG tablet Take 600 mg by mouth 2 (two) times daily as needed. For pain       . DISCONTD: medroxyPROGESTERone (PROVERA) 5 MG tablet Take 2 tablets (10 mg total) by mouth daily.  20 tablet  0    Review of Systems Review of Systems  Blood pressure 123/76, pulse 88, temperature 98.4 F (36.9 C), temperature source Oral, resp. rate 12, height 5\' 6"  (1.676 m), weight 229 lb (103.874 kg), last menstrual period 06/29/2012.  Physical Exam Physical Exam Lungs: CTA CV: RRR Abd: Obese, NT, ND GU: EGBUS: no lesions Vagina: no blood in vault Cervix: no lesion; no mucopurulent d/c Uterus: small, mobile Adnexa: no masses;NT     Assessment PCOS- reviewed ds and risks Annual- never had PAP Menorrhagia- d/w pt tx options including in OCP's, Provera and Mirena-  Reviewed side efftect of Mirena      Plan    F/u PAP Labs: Lipid panel, HbA1c, CBC F/u for  [employer] Harraway-Smith, M.D., [facility], Brinna Divelbiss 07/06/2012, 3:51 PM

## 2012-07-15 ENCOUNTER — Encounter: Payer: Self-pay | Admitting: Obstetrics & Gynecology

## 2012-07-15 ENCOUNTER — Ambulatory Visit (INDEPENDENT_AMBULATORY_CARE_PROVIDER_SITE_OTHER): Payer: BC Managed Care – PPO | Admitting: Obstetrics & Gynecology

## 2012-07-15 VITALS — BP 112/72 | HR 81 | Temp 98.4°F | Ht 65.0 in | Wt 227.0 lb

## 2012-07-15 DIAGNOSIS — R7303 Prediabetes: Secondary | ICD-10-CM

## 2012-07-15 DIAGNOSIS — R7309 Other abnormal glucose: Secondary | ICD-10-CM

## 2012-07-15 DIAGNOSIS — N920 Excessive and frequent menstruation with regular cycle: Secondary | ICD-10-CM

## 2012-07-15 DIAGNOSIS — Z30430 Encounter for insertion of intrauterine contraceptive device: Secondary | ICD-10-CM

## 2012-07-15 LAB — POCT PREGNANCY, URINE: Preg Test, Ur: NEGATIVE

## 2012-07-15 MED ORDER — METFORMIN HCL 1000 MG PO TABS: 1000.0000 mg | ORAL_TABLET | Freq: Two times a day (BID) | ORAL | Status: DC

## 2012-07-15 MED ORDER — LEVONORGESTREL 20 MCG/24HR IU IUD: 1.0000 | INTRAUTERINE_SYSTEM | Freq: Once | INTRAUTERINE | Status: DC

## 2012-07-15 NOTE — Patient Instructions (Signed)
Metformin tablets What is this medicine? METFORMIN (met FOR min) is used to treat type 2 diabetes. It helps to control blood sugar. Treatment is combined with diet and exercise. This medicine can be used alone or with other medicines for diabetes. This medicine may be used for other purposes; ask your health care provider or pharmacist if you have questions. What should I tell my health care provider before I take this medicine? They need to know if you have any of these conditions: -anemia -frequently drink alcohol-containing beverages -become easily dehydrated -heart attack -heart failure that is treated with medications -kidney disease -liver disease -polycystic ovary syndrome -serious infection or injury -vomiting -an unusual or allergic reaction to metformin, other medicines, foods, dyes, or preservatives -pregnant or trying to get pregnant -breast-feeding How should I use this medicine? Take this medicine by mouth. Take it with meals. Swallow the tablets with a drink of water. Follow the directions on the prescription label. Take your medicine at regular intervals. Do not take your medicine more often than directed. Talk to your pediatrician regarding the use of this medicine in children. While this drug may be prescribed for children as young as 16 years of age for selected conditions, precautions do apply. Overdosage: If you think you have taken too much of this medicine contact a poison control center or emergency room at once. NOTE: This medicine is only for you. Do not share this medicine with others. What if I miss a dose? If you miss a dose, take it as soon as you can. If it is almost time for your next dose, take only that dose. Do not take double or extra doses. What may interact with this medicine? Do not take this medicine with any of the following medications: -dofetilide -gatifloxacin -certain contrast medicines given before X-rays, CT scans, MRI, or other  procedures This medicine may also interact with the following medications: -digoxin -diuretics -female hormones, like estrogens or progestins and birth control pills -isoniazid -medicines for blood pressure, heart disease, irregular heart beat -morphine -nicotinic acid -phenothiazines like chlorpromazine, mesoridazine, prochlorperazine, thioridazine -phenytoin -procainamide -quinidine -quinine -ranitidine -steroid medicines like prednisone or cortisone -stimulant medicines for attention disorders, weight loss, or to stay awake -thyroid medicines -trimethoprim -vancomycin This list may not describe all possible interactions. Give your health care provider a list of all the medicines, herbs, non-prescription drugs, or dietary supplements you use. Also tell them if you smoke, drink alcohol, or use illegal drugs. Some items may interact with your medicine. What should I watch for while using this medicine? Visit your doctor or health care professional for regular checks on your progress. Learn how to check your blood sugar. Learn the symptoms of low and high blood sugar and how to manage them. If you have low blood sugar, eat or drink something that has sugar. Make sure others know to get medical help quickly if you have serious symptoms of low blood sugar, like if you become unconscious or have a seizure. If you need surgery or if you will need a procedure with contrast drugs, tell your doctor or health care professional that you are taking this medicine. Wear a medical identification bracelet or chain to say you have diabetes, and carry a card that lists all your medications. What side effects may I notice from receiving this medicine? Side effects that you should report to your doctor or health care professional as soon as possible: -allergic reactions like skin rash, itching or hives, swelling of the face,  lips, or tongue -breathing problems -feeling faint or lightheaded, falls -low  blood sugar (ask your doctor or health care professional for a list of these symptoms) -muscle aches or pains -slow or irregular heartbeat -unusual stomach pain or discomfort -unusually tired or weak Side effects that usually do not require medical attention (report to your doctor or health care professional if they continue or are bothersome): -diarrhea -headache -heartburn -metallic taste in mouth -nausea -stomach gas, upset This list may not describe all possible side effects. Call your doctor for medical advice about side effects. You may report side effects to FDA at 1-800-FDA-1088. Where should I keep my medicine? Keep out of the reach of children. Store at room temperature between 15 and 30 degrees C (59 and 86 degrees F). Protect from moisture and light. Throw away any unused medicine after the expiration date. NOTE: This sheet is a summary. It may not cover all possible information. If you have questions about this medicine, talk to your doctor, pharmacist, or health care provider.  2012, Elsevier/Gold Standard. (06/13/2008 3:40:54 PM)Levonorgestrel intrauterine device (IUD) What is this medicine? LEVONORGESTREL IUD (LEE voe nor jes trel) is a contraceptive (birth control) device. It is used to prevent pregnancy and to treat heavy bleeding that occurs during your period. It can be used for up to 5 years. This medicine may be used for other purposes; ask your health care provider or pharmacist if you have questions. What should I tell my health care provider before I take this medicine? They need to know if you have any of these conditions: -abnormal Pap smear -cancer of the breast, uterus, or cervix -diabetes -endometritis -genital or pelvic infection now or in the past -have more than one sexual partner or your partner has more than one partner -heart disease -history of an ectopic or tubal pregnancy -immune system problems -IUD in place -liver disease or tumor -problems  with blood clots or take blood-thinners -use intravenous drugs -uterus of unusual shape -vaginal bleeding that has not been explained -an unusual or allergic reaction to levonorgestrel, other hormones, silicone, or polyethylene, medicines, foods, dyes, or preservatives -pregnant or trying to get pregnant -breast-feeding How should I use this medicine? This device is placed inside the uterus by a health care professional. Talk to your pediatrician regarding the use of this medicine in children. Special care may be needed. Overdosage: If you think you have taken too much of this medicine contact a poison control center or emergency room at once. NOTE: This medicine is only for you. Do not share this medicine with others. What if I miss a dose? This does not apply. What may interact with this medicine? Do not take this medicine with any of the following medications: -amprenavir -bosentan -fosamprenavir This medicine may also interact with the following medications: -aprepitant -barbiturate medicines for inducing sleep or treating seizures -bexarotene -griseofulvin -medicines to treat seizures like carbamazepine, ethotoin, felbamate, oxcarbazepine, phenytoin, topiramate -modafinil -pioglitazone -rifabutin -rifampin -rifapentine -some medicines to treat HIV infection like atazanavir, indinavir, lopinavir, nelfinavir, tipranavir, ritonavir -St. John's wort -warfarin This list may not describe all possible interactions. Give your health care provider a list of all the medicines, herbs, non-prescription drugs, or dietary supplements you use. Also tell them if you smoke, drink alcohol, or use illegal drugs. Some items may interact with your medicine. What should I watch for while using this medicine? Visit your doctor or health care professional for regular check ups. See your doctor if you or your partner has  sexual contact with others, becomes HIV positive, or gets a sexual transmitted  disease. This product does not protect you against HIV infection (AIDS) or other sexually transmitted diseases. You can check the placement of the IUD yourself by reaching up to the top of your vagina with clean fingers to feel the threads. Do not pull on the threads. It is a good habit to check placement after each menstrual period. Call your doctor right away if you feel more of the IUD than just the threads or if you cannot feel the threads at all. The IUD may come out by itself. You may become pregnant if the device comes out. If you notice that the IUD has come out use a backup birth control method like condoms and call your health care provider. Using tampons will not change the position of the IUD and are okay to use during your period. What side effects may I notice from receiving this medicine? Side effects that you should report to your doctor or health care professional as soon as possible: -allergic reactions like skin rash, itching or hives, swelling of the face, lips, or tongue -fever, flu-like symptoms -genital sores -high blood pressure -no menstrual period for 6 weeks during use -pain, swelling, warmth in the leg -pelvic pain or tenderness -severe or sudden headache -signs of pregnancy -stomach cramping -sudden shortness of breath -trouble with balance, talking, or walking -unusual vaginal bleeding, discharge -yellowing of the eyes or skin Side effects that usually do not require medical attention (report to your doctor or health care professional if they continue or are bothersome): -acne -breast pain -change in sex drive or performance -changes in weight -cramping, dizziness, or faintness while the device is being inserted -headache -irregular menstrual bleeding within first 3 to 6 months of use -nausea This list may not describe all possible side effects. Call your doctor for medical advice about side effects. You may report side effects to FDA at  1-800-FDA-1088. Where should I keep my medicine? This does not apply. NOTE: This sheet is a summary. It may not cover all possible information. If you have questions about this medicine, talk to your doctor, pharmacist, or health care provider.  2012, Elsevier/Gold Standard. (12/16/2008 6:39:08 PM)

## 2012-07-15 NOTE — Progress Notes (Signed)
Subjective:     Patient ID: Ana [name], female   DOB: Jun 16, 1986, 26 y.o.   MRN: [id number]  HPI Pt with h/o menorrhagia.  Here for Mirena IUD.  Prev labs revealed pre-diabetes.   Review of Systems     Objective:   Physical Exam Pt in NAD Patient identified, informed consent performed.  Discussed risks of irregular bleeding, cramping, infection, malpositioning or misplacement of the IUD outside the uterus which may require further procedures. Time out was performed.  Urine pregnancy test negative.  Speculum placed in the vagina.  Cervix visualized.  Cleaned with Betadine x 2. Hurricaine spray applied to ant lip of cervix.  Cervix grasped anteriorly with a single tooth tenaculum.  Uterus sounded to 7 cm.  Mirena IUD placed per manufacturer's recommendations.  Strings trimmed to 3 cm. Tenaculum was removed, good hemostasis noted.  Patient tolerated procedure well.   Patient was given post-procedure instructions and the Mirena care card with expiration date.  Patient was also asked to check IUD strings periodically and follow up in 4-6 weeks for IUD check.        Assessment:     IUD placement PreDiabetes Abnormal lipid panel    Plan:   F/u 4-6 weeks Metformin 1000mg  q day x 1 week then bid D/W pt diet and exercise Repeat fasting lipid panel in 3 months  Dayanna Pryce L. Harraway-Smith, M.D., [facility]

## 2012-07-23 ENCOUNTER — Emergency Department (HOSPITAL_COMMUNITY)
Admission: EM | Admit: 2012-07-23 | Discharge: 2012-07-24 | Disposition: A | Discharge status: Home / Self Care | Payer: BC Managed Care – PPO | Attending: Emergency Medicine | Admitting: Emergency Medicine

## 2012-07-23 ENCOUNTER — Encounter (HOSPITAL_COMMUNITY): Payer: Self-pay | Admitting: Emergency Medicine

## 2012-07-23 DIAGNOSIS — N938 Other specified abnormal uterine and vaginal bleeding: Secondary | ICD-10-CM

## 2012-07-23 DIAGNOSIS — E119 Type 2 diabetes mellitus without complications: Secondary | ICD-10-CM | POA: Insufficient documentation

## 2012-07-23 DIAGNOSIS — F17200 Nicotine dependence, unspecified, uncomplicated: Secondary | ICD-10-CM | POA: Insufficient documentation

## 2012-07-23 DIAGNOSIS — N898 Other specified noninflammatory disorders of vagina: Secondary | ICD-10-CM | POA: Insufficient documentation

## 2012-07-23 DIAGNOSIS — D649 Anemia, unspecified: Secondary | ICD-10-CM | POA: Insufficient documentation

## 2012-07-23 NOTE — ED Notes (Signed)
PT. REPORTS VAGINAL PAIN / BLEEDING ONSET TODAY , PT. STATES " IUD CAME OUT" TODAY . UNUSUAL HEAVY VAGINAL BLEEDING 4TH DAY OF PERIOD.

## 2012-07-24 ENCOUNTER — Ambulatory Visit (INDEPENDENT_AMBULATORY_CARE_PROVIDER_SITE_OTHER): Payer: BC Managed Care – PPO | Admitting: Obstetrics & Gynecology

## 2012-07-24 ENCOUNTER — Telehealth: Payer: Self-pay | Admitting: Obstetrics & Gynecology

## 2012-07-24 ENCOUNTER — Other Ambulatory Visit (HOSPITAL_COMMUNITY)
Admission: RE | Admit: 2012-07-24 | Discharge: 2012-07-24 | Disposition: A | Discharge status: Home / Self Care | Payer: BC Managed Care – PPO | Source: Ambulatory Visit | Attending: Obstetrics & Gynecology | Admitting: Obstetrics & Gynecology

## 2012-07-24 ENCOUNTER — Encounter: Payer: Self-pay | Admitting: Medical

## 2012-07-24 VITALS — BP 119/76 | HR 95 | Temp 97.1°F | Resp 12 | Ht 65.0 in | Wt 225.1 lb

## 2012-07-24 DIAGNOSIS — Z30430 Encounter for insertion of intrauterine contraceptive device: Secondary | ICD-10-CM

## 2012-07-24 DIAGNOSIS — N920 Excessive and frequent menstruation with regular cycle: Secondary | ICD-10-CM

## 2012-07-24 DIAGNOSIS — N841 Polyp of cervix uteri: Secondary | ICD-10-CM | POA: Insufficient documentation

## 2012-07-24 LAB — PREGNANCY, URINE: Preg Test, Ur: NEGATIVE

## 2012-07-24 LAB — CBC
Hemoglobin: 10.7 g/dL — ABNORMAL LOW (ref 12.0–15.0)
MCHC: 31.9 g/dL (ref 30.0–36.0)
Platelets: 251 10*3/uL (ref 150–400)

## 2012-07-24 MED ORDER — LEVONORGESTREL 20 MCG/24HR IU IUD: 1.0000 | INTRAUTERINE_SYSTEM | Freq: Once | INTRAUTERINE | Status: DC

## 2012-07-24 MED ORDER — HYDROCODONE-ACETAMINOPHEN 7.5-750 MG PO TABS: 1.0000 | ORAL_TABLET | Freq: Four times a day (QID) | ORAL | Status: AC | PRN

## 2012-07-24 MED ORDER — MEDROXYPROGESTERONE ACETATE 5 MG PO TABS: ORAL_TABLET | ORAL | Status: DC

## 2012-07-24 MED ORDER — MEDROXYPROGESTERONE ACETATE 10 MG PO TABS: 20.0000 mg | ORAL_TABLET | Freq: Every day | ORAL | Status: DC

## 2012-07-24 MED ORDER — MEDROXYPROGESTERONE ACETATE 10 MG PO TABS: ORAL_TABLET | ORAL | Status: DC

## 2012-07-24 NOTE — ED Notes (Signed)
Pt not in 18B at this time

## 2012-07-24 NOTE — ED Provider Notes (Signed)
History     CSN: [id number]  Arrival date & time 07/23/12  2045   First MD Initiated Contact with Patient 07/23/12 2358      Chief Complaint  Patient presents with  . Vaginal Bleeding    (Consider location/radiation/quality/duration/timing/severity/associated sxs/prior treatment) Patient is a 26 y.o. female presenting with vaginal bleeding. The history is provided by the patient.  Vaginal Bleeding  pt c/o vaginal bleeding in past 1-2 days. States similar to bleeding during heavy period. Used 10-12 tampons in past day. No faintness or dizziness. No syncope. Hx menorrhagia, possible polycystic ovaries, had iud placed 8 days ago for heavy bleeding. Spotting since. [facility] Denies vaginal discharge. Only sexually active w female partner. No anticoag use. No hx fibroids. No fever or chills. Intermittent lower abd cramping bil, no constant or focal abd pain.   Past Medical History  Diagnosis Date  . Anemia   . Blood transfusion     May 2012  . Diabetes mellitus     History reviewed. No pertinent past surgical history.  Family History  Problem Relation Age of Onset  . Hypertension Paternal Aunt   . Diabetes Maternal Grandmother     History  Substance Use Topics  . Smoking status: Current Everyday Smoker -- 0.2 packs/day for 3 years    Types: Cigarettes  . Smokeless tobacco: Former Neurosurgeon    Quit date: 07/15/2012  . Alcohol Use: No    OB History    Grav Para Term Preterm Abortions TAB SAB Ect Mult Living   0               Review of Systems  Constitutional: Negative for fever and chills.  Gastrointestinal: Negative for vomiting and diarrhea.  Genitourinary: Positive for vaginal bleeding. Negative for hematuria and vaginal discharge.  Musculoskeletal: Negative for back pain.  Skin: Negative for pallor.  Neurological: Negative for syncope and light-headedness.    Allergies  Review of patient's allergies indicates no known allergies.  Home Medications     Current Outpatient Rx  Name Route Sig Dispense Refill  . FERROUS SULFATE 325 (65 FE) MG PO TABS Oral Take 325 mg by mouth 2 (two) times daily.      . IBUPROFEN 200 MG PO TABS Oral Take 600 mg by mouth 2 (two) times daily as needed. For pain     . LEVONORGESTREL 20 MCG/24HR IU IUD Intrauterine 1 Intra Uterine Device (1 each total) by Intrauterine route once. 1 each 0  . METFORMIN HCL 1000 MG PO TABS Oral Take 1,000 mg by mouth daily with breakfast.      BP 129/75  Pulse 82  Temp 98.8 F (37.1 C) (Oral)  Resp 16  SpO2 100%  LMP 07/18/2012  Physical Exam  Nursing note and vitals reviewed. Constitutional: She is oriented to person, place, and time. She appears well-developed and well-nourished. No distress.  HENT:  Head: Atraumatic.  Eyes: Conjunctivae are normal. No scleral icterus.  Neck: Neck supple. No tracheal deviation present.  Cardiovascular: Normal rate, regular rhythm, normal heart sounds and intact distal pulses.   Pulmonary/Chest: Effort normal and breath sounds normal. No respiratory distress.  Abdominal: Soft. Normal appearance and bowel sounds are normal. She exhibits no distension and no mass. There is no tenderness. There is no rebound and no guarding.  Genitourinary:       No cva tenderness. Mod amt dark blood in vagina, no active/heavy bleeding. Cervix closed. No cmt. No adx masses/tenderness. No vaginal lacerations.  Musculoskeletal: She exhibits no edema.  Neurological: She is alert and oriented to person, place, and time.  Skin: Skin is warm and dry. No rash noted. No pallor.  Psychiatric: She has a normal mood and affect.    ED Course  Procedures (including critical care time)   Labs Reviewed  PREGNANCY, URINE  CBC   Results for orders placed during the hospital encounter of 07/23/12  PREGNANCY, URINE      Component Value Range   Preg Test, Ur NEGATIVE  NEGATIVE  CBC      Component Value Range   WBC 7.9  4.0 - 10.5 K/uL   RBC 4.72  3.87 - 5.11  MIL/uL   Hemoglobin 10.7 (*) 12.0 - 15.0 g/dL   HCT 40.9 (*) 81.1 - 91.4 %   MCV 71.0 (*) 78.0 - 100.0 fL   MCH 22.7 (*) 26.0 - 34.0 pg   MCHC 31.9  30.0 - 36.0 g/dL   RDW 78.2 (*) 95.6 - 21.3 %   Platelets 251  150 - 400 K/uL       MDM  Labs.   Pt states rx w progestins in past had worked/helped for her heavy vaginal bleeding. States she didn't tolerate rx w other ocps/estrogens.  Will rx and discussed close f[facility] at [facility], continue iron.   abd soft nt.         Suzi Roots, MD 07/24/12 [phone]

## 2012-07-24 NOTE — ED Notes (Signed)
Pelvic cart set up at bedside  

## 2012-07-24 NOTE — ED Notes (Signed)
Pt states she had a marina ring inserted  9 days ago and it came out spontaneously  today.  States shooting pain  In lower mid abd down to "cervical area".  States vaginal bleeding heavy today.  Has used approx 12 super tampons today. States constant pain in lower abdomen rated 1-2.  Took Ibuprofen 600 mg at 1000hrs  Without effect

## 2012-07-24 NOTE — Telephone Encounter (Signed)
Pt called earlier to say that her IUD fell out.  She made an appt but, did not f/u.  TC to pt to rec f/u and eval.  Left message to call back.  clh-S

## 2012-07-24 NOTE — Patient Instructions (Addendum)

## 2012-07-24 NOTE — Progress Notes (Signed)
Subjective:     Patient ID: Ana [name], female   DOB: 04/07/1986, 26 y.o.   MRN: [id number]  HPI Pt c/o heary bleeding last night then expulsion of the IUD.  Wants to precede replacement of the IUD.  Seen in the ER at [facility] last night and was given a Rx for Provera which pt has not taken.  C/O severe pain with bleeding.  Review of Systems     Objective:   Physical Exam   Patient identified, informed consent performed.  Discussed risks of irregular bleeding, cramping, infection, malpositioning or misplacement of the IUD outside the uterus which may require further procedures. Time out was performed.  Urine pregnancy test negative.  Speculum placed in the vagina.  Cervix visualized.  Large amount of blood in vault.  Cervical polyp noted at cervix. Cleaned with Betadine x 2. Hurricaine spray applied to ant lip of cervix.  Cervix grasped anteriorly with a single tooth tenaculum.  Uterus sounded to 6 cm.  Mirena IUD placed per manufacturer's recommendations.  Strings trimmed to 3 cm. Tenaculum was removed, good hemostasis noted.  Patient tolerated procedure well.   Patient was given post-procedure instructions and the Mirena care card with expiration date.  Patient was also asked to check IUD strings periodically and follow up in 4-6 weeks for IUD check.  Assessment:     IUD expulsion and replacement Menorrhagia   Plan:     IUD replaced Provera 20 mg po q day F/u 4 weeks or sooner prn'reviewed risks of IUD Send tissue to path  Brady Plant L. Harraway-Smith, M.D., [facility]

## 2012-08-13 ENCOUNTER — Ambulatory Visit: Payer: BC Managed Care – PPO | Admitting: Obstetrics & Gynecology

## 2012-08-18 ENCOUNTER — Encounter (HOSPITAL_COMMUNITY): Payer: Self-pay | Admitting: Emergency Medicine

## 2012-08-18 ENCOUNTER — Emergency Department (INDEPENDENT_AMBULATORY_CARE_PROVIDER_SITE_OTHER)
Admission: EM | Admit: 2012-08-18 | Discharge: 2012-08-18 | Disposition: A | Discharge status: Home / Self Care | Payer: BC Managed Care – PPO | Source: Home / Self Care | Attending: Family Medicine | Admitting: Family Medicine

## 2012-08-18 DIAGNOSIS — N760 Acute vaginitis: Secondary | ICD-10-CM

## 2012-08-18 DIAGNOSIS — R300 Dysuria: Secondary | ICD-10-CM

## 2012-08-18 LAB — POCT URINALYSIS DIP (DEVICE)
Bilirubin Urine: NEGATIVE
Glucose, UA: NEGATIVE mg/dL
Ketones, ur: NEGATIVE mg/dL
Leukocytes, UA: NEGATIVE
Nitrite: NEGATIVE
pH: 6.5 (ref 5.0–8.0)

## 2012-08-18 LAB — WET PREP, GENITAL: Yeast Wet Prep HPF POC: NONE SEEN

## 2012-08-18 LAB — POCT PREGNANCY, URINE: Preg Test, Ur: NEGATIVE

## 2012-08-18 MED ORDER — FLUCONAZOLE 150 MG PO TABS: ORAL_TABLET | ORAL | Status: DC

## 2012-08-18 MED ORDER — CIPROFLOXACIN HCL 500 MG PO TABS: 500.0000 mg | ORAL_TABLET | Freq: Two times a day (BID) | ORAL | Status: AC

## 2012-08-18 MED ORDER — NAPROXEN 500 MG PO TABS: 500.0000 mg | ORAL_TABLET | Freq: Two times a day (BID) | ORAL | Status: AC

## 2012-08-18 MED ORDER — NYSTATIN-TRIAMCINOLONE 100000-0.1 UNIT/GM-% EX CREA: TOPICAL_CREAM | CUTANEOUS | Status: AC

## 2012-08-18 MED ORDER — PHENAZOPYRIDINE HCL 200 MG PO TABS: 200.0000 mg | ORAL_TABLET | Freq: Three times a day (TID) | ORAL | Status: AC

## 2012-08-18 NOTE — ED Notes (Signed)
Pt having lower abd pain that sometimes correlates with urination. Pt has a history of dysmenorrhea, but states this is a different pain. Pt had Mirena IUD placed a couple weeks ago with complications causing it to have to be placed twice. Pt has a history of irregularity also. Pt describes the pain as sharp that comes and goes and sometimes worse after peeing. Pt indicates pain is in lower bladder area traveling down to the urethra and clitoral area. Pt states she has had less discharge than usual, but had one incident of dark brown discharge when providing [place] with a sample here today.

## 2012-08-19 LAB — URINE CULTURE
Colony Count: NO GROWTH
Culture: NO GROWTH

## 2012-08-19 MED ORDER — METRONIDAZOLE 500 MG PO TABS: 500.0000 mg | ORAL_TABLET | Freq: Two times a day (BID) | ORAL | Status: AC

## 2012-08-20 ENCOUNTER — Telehealth (HOSPITAL_COMMUNITY): Payer: Self-pay | Admitting: *Deleted

## 2012-08-20 NOTE — ED Notes (Signed)
GC/Chalamydia neg., Wet prep: many clue cells, many WBC's, Urine culture: no growth.  9/11  Labs shown to [facility] PA and she e-prescribed Flagyl to [facility] at [place] and [facility].  9/12  I called and left message to call. Ana [name] 08/20/2012

## 2012-08-20 NOTE — ED Notes (Signed)
Pt. called back.  Pt. verified x 2 and given results.  Pt. told she needs Flagyl for bacterial vaginosis.  I explained what bacterial vaginosis was and what causes it.   Pt. instructed to no alcohol while taking this medication.  Pt. told Rx. was sent to [facility] on [address].  Pt. voiced understanding. Vassie Moselle 08/20/2012

## 2012-08-20 NOTE — ED Provider Notes (Signed)
History     CSN: [id number]  Arrival date & time 08/18/12  1619   First MD Initiated Contact with Patient 08/18/12 1701      Chief Complaint  Patient presents with  . Dysuria    (Consider location/radiation/quality/duration/timing/severity/associated sxs/prior treatment) HPI Comments: 26 year old female with no significant past medical history. Had Mirena IUD placed 2 weeks ago for birth control. Here complaining of low abdominal pain described as pressure-type worse with voiding. Also reports vaginal burning and itchiness and brown vaginal discharge. Denies fever or chills. No nausea vomiting or diarrhea. Appetite remains stable. Patient has a history of polycystic ovarian syndrome and prediabetic state.   Past Medical History  Diagnosis Date  . Anemia   . Blood transfusion     May 2012  . Diabetes mellitus     History reviewed. No pertinent past surgical history.  Family History  Problem Relation Age of Onset  . Hypertension Paternal Aunt   . Diabetes Maternal Grandmother     History  Substance Use Topics  . Smoking status: Current Every Day Smoker -- 0.1 packs/day for 3 years    Types: Cigarettes  . Smokeless tobacco: Former Neurosurgeon    Quit date: 07/15/2012  . Alcohol Use: No    OB History    Grav Para Term Preterm Abortions TAB SAB Ect Mult Living   0               Review of Systems  Constitutional: Negative for fever and chills.  Gastrointestinal: Negative for nausea, vomiting and diarrhea.  Genitourinary: Positive for dysuria, vaginal discharge and pelvic pain. Negative for flank pain and vaginal bleeding.  Neurological: Negative for dizziness and headaches.    Allergies  Review of patient's allergies indicates no known allergies.  Home Medications   Current Outpatient Rx  Name Route Sig Dispense Refill  . LEVONORGESTREL 20 MCG/24HR IU IUD Intrauterine 1 Intra Uterine Device (1 each total) by Intrauterine route once. 1 each 0  . METFORMIN HCL 1000 MG  PO TABS Oral Take 1,000 mg by mouth daily with breakfast.    . CIPROFLOXACIN HCL 500 MG PO TABS Oral Take 1 tablet (500 mg total) by mouth 2 (two) times daily. 6 tablet 0  . FERROUS SULFATE 325 (65 FE) MG PO TABS Oral Take 325 mg by mouth 2 (two) times daily.      Marland Kitchen FLUCONAZOLE 150 MG PO TABS  1 tab po q72h x4 doses 4 tablet 0  . LEVONORGESTREL 20 MCG/24HR IU IUD Intrauterine 1 Intra Uterine Device (1 each total) by Intrauterine route once. 1 each 0  . METRONIDAZOLE 500 MG PO TABS Oral Take 1 tablet (500 mg total) by mouth 2 (two) times daily. 14 tablet 0  . ADULT MULTIVITAMIN W/MINERALS CH Oral Take 1 tablet by mouth daily.    Marland Kitchen NAPROXEN 500 MG PO TABS Oral Take 1 tablet (500 mg total) by mouth 2 (two) times daily. 20 tablet 0  . NYSTATIN-TRIAMCINOLONE 100000-0.1 UNIT/GM-% EX CREA  Apply to affected area daily 15 g 0  . PHENAZOPYRIDINE HCL 200 MG PO TABS Oral Take 1 tablet (200 mg total) by mouth 3 (three) times daily. 6 tablet 0    BP 120/67  Pulse 92  Temp 99 F (37.2 C) (Oral)  Resp 18  SpO2 100%  LMP 07/18/2012  Physical Exam  Nursing note and vitals reviewed. Constitutional: She is oriented to person, place, and time. She appears well-developed and well-nourished. No distress.  HENT:  Head:  Normocephalic and atraumatic.  Mouth/Throat: Oropharynx is clear and moist.  Eyes: Conjunctivae normal are normal.  Neck: Neck supple. No thyromegaly present.  Cardiovascular: Normal heart sounds.   Pulmonary/Chest: Breath sounds normal.  Abdominal: Soft. Bowel sounds are normal. She exhibits no distension and no mass. There is no tenderness. There is no rebound and no guarding.  Genitourinary: Uterus normal. Cervix exhibits no motion tenderness. Right adnexum displays no mass, no tenderness and no fullness. Left adnexum displays no mass, no tenderness and no fullness. There is erythema around the vagina. No bleeding around the vagina. Vaginal discharge found.  Lymphadenopathy:    She has no  cervical adenopathy.       Right: No inguinal adenopathy present.       Left: No inguinal adenopathy present.  Neurological: She is alert and oriented to person, place, and time.  Skin:       Hyperpigmentation in both groin intertriginous areas. There is erythema and maceration in right groin fold.    ED Course  Procedures (including critical care time)  Labs Reviewed  POCT URINALYSIS DIP (DEVICE) - Abnormal; Notable for the following:    Hgb urine dipstick SMALL (*)     All other components within normal limits  WET PREP, GENITAL - Abnormal; Notable for the following:    Clue Cells Wet Prep HPF POC MANY (*)     WBC, Wet Prep HPF POC MANY (*)     All other components within normal limits  POCT PREGNANCY, URINE  GC/CHLAMYDIA PROBE AMP, GENITAL  URINE CULTURE  LAB REPORT - SCANNED   No results found.   1. Acute vulvovaginitis   2. Dysuria       MDM  Treated for possible UTI with Cipro. Also vulvovaginitis treated with Diflucan as impress Candida related. Impress candida intertrigo treated with nystatin. Wet prep, urine culture and GC and Chlamydia test were pending at the time of discharge.        Sharin Grave, MD 08/24/12 [phone]

## 2012-08-31 ENCOUNTER — Ambulatory Visit (INDEPENDENT_AMBULATORY_CARE_PROVIDER_SITE_OTHER): Payer: BC Managed Care – PPO | Admitting: Obstetrics & Gynecology

## 2012-08-31 ENCOUNTER — Encounter: Payer: Self-pay | Admitting: Obstetrics & Gynecology

## 2012-08-31 VITALS — BP 113/73 | HR 91 | Temp 98.0°F | Ht 65.0 in | Wt 225.2 lb

## 2012-08-31 DIAGNOSIS — R102 Pelvic and perineal pain: Secondary | ICD-10-CM

## 2012-08-31 DIAGNOSIS — N949 Unspecified condition associated with female genital organs and menstrual cycle: Secondary | ICD-10-CM

## 2012-08-31 DIAGNOSIS — N920 Excessive and frequent menstruation with regular cycle: Secondary | ICD-10-CM

## 2012-08-31 MED ORDER — METFORMIN HCL 1000 MG PO TABS: 1000.0000 mg | ORAL_TABLET | Freq: Two times a day (BID) | ORAL | Status: DC

## 2012-08-31 NOTE — Patient Instructions (Signed)
Levonorgestrel intrauterine device (IUD) What is this medicine? LEVONORGESTREL IUD (LEE voe nor jes trel) is a contraceptive (birth control) device. It is used to prevent pregnancy and to treat heavy bleeding that occurs during your period. It can be used for up to 5 years. This medicine may be used for other purposes; ask your health care provider or pharmacist if you have questions. What should I tell my health care provider before I take this medicine? They need to know if you have any of these conditions: -abnormal Pap smear -cancer of the breast, uterus, or cervix -diabetes -endometritis -genital or pelvic infection now or in the past -have more than one sexual partner or your partner has more than one partner -heart disease -history of an ectopic or tubal pregnancy -immune system problems -IUD in place -liver disease or tumor -problems with blood clots or take blood-thinners -use intravenous drugs -uterus of unusual shape -vaginal bleeding that has not been explained -an unusual or allergic reaction to levonorgestrel, other hormones, silicone, or polyethylene, medicines, foods, dyes, or preservatives -pregnant or trying to get pregnant -breast-feeding How should I use this medicine? This device is placed inside the uterus by a health care professional. Talk to your pediatrician regarding the use of this medicine in children. Special care may be needed. Overdosage: If you think you have taken too much of this medicine contact a poison control center or emergency room at once. NOTE: This medicine is only for you. Do not share this medicine with others. What if I miss a dose? This does not apply. What may interact with this medicine? Do not take this medicine with any of the following medications: -amprenavir -bosentan -fosamprenavir This medicine may also interact with the following medications: -aprepitant -barbiturate medicines for inducing sleep or treating  seizures -bexarotene -griseofulvin -medicines to treat seizures like carbamazepine, ethotoin, felbamate, oxcarbazepine, phenytoin, topiramate -modafinil -pioglitazone -rifabutin -rifampin -rifapentine -some medicines to treat HIV infection like atazanavir, indinavir, lopinavir, nelfinavir, tipranavir, ritonavir -St. John's wort -warfarin This list may not describe all possible interactions. Give your health care provider a list of all the medicines, herbs, non-prescription drugs, or dietary supplements you use. Also tell them if you smoke, drink alcohol, or use illegal drugs. Some items may interact with your medicine. What should I watch for while using this medicine? Visit your doctor or health care professional for regular check ups. See your doctor if you or your partner has sexual contact with others, becomes HIV positive, or gets a sexual transmitted disease. This product does not protect you against HIV infection (AIDS) or other sexually transmitted diseases. You can check the placement of the IUD yourself by reaching up to the top of your vagina with clean fingers to feel the threads. Do not pull on the threads. It is a good habit to check placement after each menstrual period. Call your doctor right away if you feel more of the IUD than just the threads or if you cannot feel the threads at all. The IUD may come out by itself. You may become pregnant if the device comes out. If you notice that the IUD has come out use a backup birth control method like condoms and call your health care provider. Using tampons will not change the position of the IUD and are okay to use during your period. What side effects may I notice from receiving this medicine? Side effects that you should report to your doctor or health care professional as soon as possible: -allergic reactions   like skin rash, itching or hives, swelling of the face, lips, or tongue -fever, flu-like symptoms -genital sores -high  blood pressure -no menstrual period for 6 weeks during use -pain, swelling, warmth in the leg -pelvic pain or tenderness -severe or sudden headache -signs of pregnancy -stomach cramping -sudden shortness of breath -trouble with balance, talking, or walking -unusual vaginal bleeding, discharge -yellowing of the eyes or skin Side effects that usually do not require medical attention (report to your doctor or health care professional if they continue or are bothersome): -acne -breast pain -change in sex drive or performance -changes in weight -cramping, dizziness, or faintness while the device is being inserted -headache -irregular menstrual bleeding within first 3 to 6 months of use -nausea This list may not describe all possible side effects. Call your doctor for medical advice about side effects. You may report side effects to FDA at 1-800-FDA-1088. Where should I keep my medicine? This does not apply. NOTE: This sheet is a summary. It may not cover all possible information. If you have questions about this medicine, talk to your doctor, pharmacist, or health care provider.  2012, [employer]vier/Gold Standard. (12/16/2008 6:39:08 PM)Menorrhagia Dysfunctional uterine bleeding is different from a normal menstrual period. When periods are heavy or there is more bleeding than is usual for you, it is called menorrhagia. It may be caused by hormonal imbalance, or physical, metabolic, or other problems. Examination is necessary in order that your caregiver may treat treatable causes. If this is a continuing problem, a D&C may be needed. That means that the cervix (the opening of the uterus or womb) is dilated (stretched larger) and the lining of the uterus is scraped out. The tissue scraped out is then examined under a microscope by a specialist (pathologist) to make sure there is nothing of concern that needs further or more extensive treatment. HOME CARE INSTRUCTIONS   If medications were prescribed,  take exactly as directed. Do not change or switch medications without consulting your caregiver.   Long term heavy bleeding may result in iron deficiency. Your caregiver may have prescribed iron pills. They help replace the iron your body lost from heavy bleeding. Take exactly as directed. Iron may cause constipation. If this becomes a problem, increase the bran, fruits, and roughage in your diet.   Do not take aspirin or medicines that contain aspirin one week before or during your menstrual period. Aspirin may make the bleeding worse.   If you need to change your sanitary pad or tampon more than once every 2 hours, stay in bed and rest as much as possible until the bleeding stops.   Eat well-balanced meals. Eat foods high in iron. Examples are leafy green vegetables, meat, liver, eggs, and whole grain breads and cereals. Do not try to lose weight until the abnormal bleeding has stopped and your blood iron level is back to normal.  SEEK MEDICAL CARE IF:   You need to change your sanitary pad or tampon more than once an hour.   You develop nausea (feeling sick to your stomach) and vomiting, dizziness, or diarrhea while you are taking your medicine.   You have any problems that may be related to the medicine you are taking.  SEEK IMMEDIATE MEDICAL CARE IF:   You have a fever.   You develop chills.   You develop severe bleeding or start to pass blood clots.   You feel dizzy or faint.  MAKE SURE YOU:   Understand these instructions.   Will  watch your condition.   Will get help right away if you are not doing well or get worse.  Document Released: 11/25/2005 Document Revised: 11/14/2011 Document Reviewed: 07/15/2008 [facility] Patient Information 2012 [place], [place].

## 2012-08-31 NOTE — Progress Notes (Signed)
Subjective:     Patient ID: Ana [name], female   DOB: 1986/03/11, 26 y.o.   MRN: [id number]  HPI  Pt here to f/u.  Bleeding improved.  Still with cramping in lower abd.  Taking Metformin 1000mg  per day.  Has tried to take 2000mg  with diarrhea.     Review of Systems n/s    Objective:   Physical ExamBP 113/73  Pulse 91  Temp 98 F (36.7 C) (Oral)  Ht 5\' 5"  (1.651 m)  Wt 225 lb 3.2 oz (102.15 kg)  BMI 37.48 kg/m2  LMP 08/29/2012  GU: EGBUS: no lesions Vagina: no blood in vault Cervix: no lesion; no mucopurulent d/c IUD strings noted       Assessment:     DUB- improved with IUS     Plan:     Cont Metformin Increase to 1000mg  po bid  Ana [name] L. Harraway-Smith, M.D., [facility]n Core

## 2013-01-23 ENCOUNTER — Other Ambulatory Visit: Payer: Self-pay

## 2013-03-07 ENCOUNTER — Emergency Department (INDEPENDENT_AMBULATORY_CARE_PROVIDER_SITE_OTHER): Payer: BC Managed Care – PPO

## 2013-03-07 ENCOUNTER — Emergency Department (HOSPITAL_COMMUNITY)
Admission: EM | Admit: 2013-03-07 | Discharge: 2013-03-07 | Disposition: A | Discharge status: Home / Self Care | Payer: BC Managed Care – PPO | Source: Home / Self Care | Attending: Family Medicine | Admitting: Family Medicine

## 2013-03-07 ENCOUNTER — Encounter (HOSPITAL_COMMUNITY): Payer: Self-pay | Admitting: *Deleted

## 2013-03-07 DIAGNOSIS — J189 Pneumonia, unspecified organism: Secondary | ICD-10-CM

## 2013-03-07 MED ORDER — ACETAMINOPHEN 325 MG PO TABS
ORAL_TABLET | ORAL | Status: AC
  Filled 2013-03-07: qty 2

## 2013-03-07 MED ORDER — ACETAMINOPHEN 325 MG PO TABS
650.0000 mg | ORAL_TABLET | Freq: Once | ORAL | Status: AC
  Administered 2013-03-07: 650 mg via ORAL

## 2013-03-07 MED ORDER — IBUPROFEN 800 MG PO TABS
800.0000 mg | ORAL_TABLET | Freq: Once | ORAL | Status: AC
  Administered 2013-03-07: 800 mg via ORAL

## 2013-03-07 MED ORDER — ALBUTEROL SULFATE (5 MG/ML) 0.5% IN NEBU
5.0000 mg | INHALATION_SOLUTION | Freq: Once | RESPIRATORY_TRACT | Status: AC
  Administered 2013-03-07: 5 mg via RESPIRATORY_TRACT

## 2013-03-07 MED ORDER — IBUPROFEN 800 MG PO TABS
ORAL_TABLET | ORAL | Status: AC
  Filled 2013-03-07: qty 1

## 2013-03-07 MED ORDER — ALBUTEROL SULFATE HFA 108 (90 BASE) MCG/ACT IN AERS: 2.0000 | INHALATION_SPRAY | RESPIRATORY_TRACT | Status: DC | PRN

## 2013-03-07 MED ORDER — ALBUTEROL SULFATE (5 MG/ML) 0.5% IN NEBU
INHALATION_SOLUTION | RESPIRATORY_TRACT | Status: AC
  Filled 2013-03-07: qty 1

## 2013-03-07 MED ORDER — IPRATROPIUM BROMIDE 0.02 % IN SOLN
0.5000 mg | Freq: Once | RESPIRATORY_TRACT | Status: AC
  Administered 2013-03-07: 0.5 mg via RESPIRATORY_TRACT

## 2013-03-07 MED ORDER — HYDROCOD POLST-CHLORPHEN POLST 10-8 MG/5ML PO LQCR: 5.0000 mL | Freq: Every evening | ORAL | Status: DC | PRN

## 2013-03-07 MED ORDER — AZITHROMYCIN 250 MG PO TABS: ORAL_TABLET | ORAL | Status: DC

## 2013-03-07 NOTE — ED Notes (Signed)
Patient complains of fever/chills cough, congestion, body aches x 3 days. Denies diarrhea.

## 2013-03-07 NOTE — ED Provider Notes (Signed)
History     CSN: [id number]  Arrival date & time 03/07/13  1127   First MD Initiated Contact with Patient 03/07/13 1129      Chief Complaint  Patient presents with  . Fever     Patient is a 27 y.o. female presenting with fever. The history is provided by the patient.  Fever Max temp prior to arrival:  101.9 Temp source:  Oral Severity:  Moderate Onset quality:  Sudden Duration:  3 days Timing:  Intermittent Progression:  Worsening Chronicity:  New Relieved by:  Acetaminophen Ineffective treatments:  Acetaminophen Associated symptoms: chills, congestion, cough, myalgias, sore throat and vomiting   Associated symptoms: no diarrhea, no dysuria, no ear pain, no nausea, no rash, no rhinorrhea and no somnolence   Pt's mother reports pt awoke on Friday AM with a "scratchy throat". That has progressed to include chest congestion, persistent cough at times productive of thick yellow mucous, w/ post-tussive vomiting, fever, body aches and chills. Tylenol improves fever temporarily but it returns.   Past Medical History  Diagnosis Date  . Anemia   . Blood transfusion     May 2012  . Diabetes mellitus     No past surgical history on file.  Family History  Problem Relation Age of Onset  . Hypertension Paternal Aunt   . Diabetes Maternal Grandmother     History  Substance Use Topics  . Smoking status: Current Every Day Smoker -- 0.10 packs/day for 3 years    Types: Cigarettes  . Smokeless tobacco: Former Neurosurgeon    Quit date: 07/15/2012  . Alcohol Use: No    OB History   Grav Para Term Preterm Abortions TAB SAB Ect Mult Living   0               Review of Systems  Constitutional: Positive for fever, chills and fatigue.  HENT: Positive for congestion and sore throat. Negative for ear pain, facial swelling, rhinorrhea, sneezing, sinus pressure and ear discharge.   Eyes: Negative.   Respiratory: Positive for cough. Negative for shortness of breath, wheezing and stridor.    Cardiovascular: Negative.   Gastrointestinal: Positive for vomiting. Negative for nausea and diarrhea.  Endocrine: Negative.   Genitourinary: Negative for dysuria, urgency and frequency.  Musculoskeletal: Positive for myalgias.  Skin: Negative for rash.  Allergic/Immunologic: Negative.   Neurological: Negative.   Hematological: Negative.   Psychiatric/Behavioral: Negative.     Allergies  Review of patient's allergies indicates no known allergies.  Home Medications   Current Outpatient Rx  Name  Route  Sig  Dispense  Refill  . ferrous sulfate 325 (65 FE) MG tablet   Oral   Take 325 mg by mouth once.          . fluconazole (DIFLUCAN) 150 MG tablet      1 tab po q72h x4 doses   4 tablet   0   . levonorgestrel (MIRENA) 20 MCG/24HR IUD   Intrauterine   1 Intra Uterine Device (1 each total) by Intrauterine route once.   1 each   0   . metFORMIN (GLUCOPHAGE) 1000 MG tablet   Oral   Take 1,000 mg by mouth daily with breakfast.         . metFORMIN (GLUCOPHAGE) 1000 MG tablet   Oral   Take 1 tablet (1,000 mg total) by mouth 2 (two) times daily with a meal.   60 tablet   6   . Multiple Vitamin (MULTIVITAMIN WITH MINERALS) TABS  Oral   Take 1 tablet by mouth daily.         . naproxen (NAPROSYN) 500 MG tablet   Oral   Take 1 tablet (500 mg total) by mouth 2 (two) times daily.   20 tablet   0   . nystatin-triamcinolone (MYCOLOG II) cream      Apply to affected area daily   15 g   0     BP 117/72  Pulse 105  Temp(Src) 101.4 F (38.6 C) (Oral)  Resp 16  SpO2 100%  LMP 02/06/2013  Physical Exam  Nursing note and vitals reviewed. Constitutional: She is oriented to person, place, and time. She appears well-developed and well-nourished.  HENT:  Head: Normocephalic and atraumatic.  Right Ear: Tympanic membrane, external ear and ear canal normal.  Left Ear: Tympanic membrane, external ear and ear canal normal.  Nose: Nose normal.  Mouth/Throat: Uvula  is midline and oropharynx is clear and moist. Mucous membranes are dry.  Eyes: Conjunctivae are normal.  Neck: Neck supple.  Cardiovascular: Regular rhythm.  Tachycardia present.   Pulmonary/Chest: Effort normal. No respiratory distress.  BBS very diminished bil w/ frequent cough.  Musculoskeletal: Normal range of motion.  Neurological: She is alert and oriented to person, place, and time.  Skin: Skin is warm and dry.  Psychiatric: She has a normal mood and affect.    ED Course  Procedures (including critical care time)  Labs Reviewed - No data to display Dg Chest 2 View  03/07/2013  *RADIOLOGY REPORT*  Clinical Data: Cough and fever.  CHEST - 2 VIEW  Comparison: None.  Findings: The heart size is is exaggerated by low lung volumes. Ill-defined left lower lobe airspace disease is present.  The upper lung fields are clear.  IMPRESSION:  1.  Ill-defined left lower lobe airspace disease may represent early bronchopneumonia. 2.  Low lung volumes.   Original Report Authenticated By: Marin Roberts, M.D.      No diagnosis found.    MDM  3 day h/o productive cough, fever, chills, sore throat and bodyaches. BBS very diminished w/ freq cough. Much improved with neb.  Chest xray findings c/w LLL PNA. Will treat with Z-Pack, albuterol inhaler and short course of med for cough to use just at night. Pt to return for worsening symptoms. Remains febrile and tachycardic at d/c.  Encouraged pt to drink lots of liquids.        Leanne Chang, NP 03/07/13 1346  Roma Kayser Schorr, NP 03/07/13 1400

## 2013-03-10 NOTE — ED Provider Notes (Signed)
Medical screening examination/treatment/procedure(s) were performed by resident physician or non-physician practitioner and as supervising physician I was immediately available for consultation/collaboration.   Alic Hilburn DOUGLAS MD.   Sims Laday D Emaline Karnes, MD 03/10/13 1946 

## 2013-10-14 ENCOUNTER — Other Ambulatory Visit: Payer: Self-pay

## 2013-12-01 ENCOUNTER — Emergency Department (INDEPENDENT_AMBULATORY_CARE_PROVIDER_SITE_OTHER)
Admission: EM | Admit: 2013-12-01 | Discharge: 2013-12-01 | Disposition: A | Discharge status: Home / Self Care | Payer: BC Managed Care – PPO | Source: Home / Self Care

## 2013-12-01 ENCOUNTER — Encounter (HOSPITAL_COMMUNITY): Payer: Self-pay | Admitting: Emergency Medicine

## 2013-12-01 DIAGNOSIS — J069 Acute upper respiratory infection, unspecified: Secondary | ICD-10-CM

## 2013-12-01 DIAGNOSIS — J45909 Unspecified asthma, uncomplicated: Secondary | ICD-10-CM

## 2013-12-01 MED ORDER — ALBUTEROL SULFATE HFA 108 (90 BASE) MCG/ACT IN AERS: 1.0000 | INHALATION_SPRAY | Freq: Four times a day (QID) | RESPIRATORY_TRACT | Status: DC | PRN

## 2013-12-01 NOTE — ED Notes (Signed)
Pt c/o cold sxs onset 3 days w/sxs that include: cough, ST, chest tightness, SOB Denies: f/v/n/d, wheezing... Taking OTC cold meds w/temp relief Alert w/no signs of acute distress.

## 2013-12-01 NOTE — ED Provider Notes (Signed)
CSN: [id number]     Arrival date & time 12/01/13  1121 History   First MD Initiated Contact with Patient 12/01/13 1241     Chief Complaint  Patient presents with  . URI   (Consider location/radiation/quality/duration/timing/severity/associated sxs/prior Treatment) HPI Comments: Obese 27 year old female presents with sore throat, cough, anterior chest soreness, nasal congestion, pressure in the ears and shortness of breath with exertion for 3 days. Is not associated with the fever. She does have a history of asthma.   Past Medical History  Diagnosis Date  . Anemia   . Blood transfusion     May 2012  . Diabetes mellitus    History reviewed. No pertinent past surgical history. Family History  Problem Relation Age of Onset  . Hypertension Paternal Aunt   . Diabetes Maternal Grandmother    History  Substance Use Topics  . Smoking status: Current Every Day Smoker -- 0.10 packs/day for 3 years    Types: Cigarettes  . Smokeless tobacco: Former Neurosurgeon    Quit date: 07/15/2012  . Alcohol Use: No   OB History   Grav Para Term Preterm Abortions TAB SAB Ect Mult Living   0              Review of Systems  Constitutional: Negative for fever, chills, activity change, appetite change and fatigue.  HENT: Positive for congestion, postnasal drip and rhinorrhea. Negative for facial swelling.   Eyes: Negative.   Respiratory: Positive for cough. Negative for shortness of breath.   Cardiovascular: Negative.   Gastrointestinal: Negative.   Musculoskeletal: Negative for neck pain and neck stiffness.  Skin: Negative for pallor and rash.  Neurological: Negative.     Allergies  Review of patient's allergies indicates no known allergies.  Home Medications   Current Outpatient Rx  Name  Route  Sig  Dispense  Refill  . albuterol (PROVENTIL HFA;VENTOLIN HFA) 108 (90 BASE) MCG/ACT inhaler   Inhalation   Inhale 2 puffs into the lungs every 4 (four) hours as needed for wheezing or shortness of  breath (and or cough).   1 Inhaler   0   . albuterol (PROVENTIL HFA;VENTOLIN HFA) 108 (90 BASE) MCG/ACT inhaler   Inhalation   Inhale 1-2 puffs into the lungs every 6 (six) hours as needed for wheezing or shortness of breath.   1 Inhaler   0   . azithromycin (ZITHROMAX Z-PAK) 250 MG tablet      Take 2 tabs on day 1 then 1 tab daily on days 2-5   6 each   0   . chlorpheniramine-HYDROcodone (TUSSIONEX PENNKINETIC ER) 10-8 MG/5ML LQCR   Oral   Take 5 mLs by mouth at bedtime as needed.   50 mL   0   . ferrous sulfate 325 (65 FE) MG tablet   Oral   Take 325 mg by mouth once.          . fluconazole (DIFLUCAN) 150 MG tablet      1 tab po q72h x4 doses   4 tablet   0   . EXPIRED: levonorgestrel (MIRENA) 20 MCG/24HR IUD   Intrauterine   1 Intra Uterine Device (1 each total) by Intrauterine route once.   1 each   0   . EXPIRED: metFORMIN (GLUCOPHAGE) 1000 MG tablet   Oral   Take 1,000 mg by mouth daily with breakfast.         . metFORMIN (GLUCOPHAGE) 1000 MG tablet   Oral   Take 1 tablet (  1,000 mg total) by mouth 2 (two) times daily with a meal.   60 tablet   6   . Multiple Vitamin (MULTIVITAMIN WITH MINERALS) TABS   Oral   Take 1 tablet by mouth daily.          BP 129/84  Pulse 99  Temp(Src) 98 F (36.7 C) (Oral)  Resp 18  SpO2 98% Physical Exam  Nursing note and vitals reviewed. Constitutional: She is oriented to person, place, and time. She appears well-developed and well-nourished. No distress.  HENT:  Mouth/Throat: No oropharyngeal exudate.  Bilateral TMs are retracted. No erythema or effusion. Oropharynx with minimal erythema and clear PND.  Eyes: Conjunctivae and EOM are normal.  Neck: Normal range of motion. Neck supple.  Cardiovascular: Normal rate, regular rhythm and normal heart sounds.   Pulmonary/Chest: Effort normal and breath sounds normal. No respiratory distress. She has no wheezes. She has no rales.  Musculoskeletal: Normal range of  motion. She exhibits no edema.  Lymphadenopathy:    She has no cervical adenopathy.  Neurological: She is alert and oriented to person, place, and time.  Skin: Skin is warm and dry. No rash noted.  Psychiatric: She has a normal mood and affect.    ED Course  Procedures (including critical care time) Labs Review Labs Reviewed  POCT RAPID STREP A ([facility] CARE ONLY)   Imaging Review No results found.    MDM   1. URI (upper respiratory infection)   2. Asthma      Alka-Seltzer cold Plus in addition to Robitussin-DM for symptoms Ibuprofen and/or Tylenol when necessary Refill albuterol HFA Drink plenty of fluids, chicken soup The lower your PCP as needed  Hayden Rasmussen, NP 12/01/13 1337

## 2013-12-03 LAB — CULTURE, GROUP A STREP

## 2013-12-03 NOTE — ED Provider Notes (Signed)
Medical screening examination/treatment/procedure(s) were performed by a resident physician or non-physician practitioner and as the supervising physician I was immediately available for consultation/collaboration.  Fredrick Geoghegan, MD    Christabell Loseke S Gloriajean Okun, MD 12/03/13 1703 

## 2014-02-12 ENCOUNTER — Emergency Department (HOSPITAL_COMMUNITY)
Admission: EM | Admit: 2014-02-12 | Discharge: 2014-02-12 | Disposition: A | Discharge status: Home / Self Care | Payer: BC Managed Care – PPO | Source: Home / Self Care

## 2014-02-12 ENCOUNTER — Encounter (HOSPITAL_COMMUNITY): Payer: Self-pay | Admitting: Emergency Medicine

## 2014-02-12 DIAGNOSIS — M25579 Pain in unspecified ankle and joints of unspecified foot: Secondary | ICD-10-CM

## 2014-02-12 MED ORDER — ETODOLAC 500 MG PO TABS: 500.0000 mg | ORAL_TABLET | Freq: Two times a day (BID) | ORAL | Status: DC

## 2014-02-12 NOTE — ED Notes (Signed)
C/o left ankle pain for little over a week.  Pt denies any known injury.  States that she woke with ankle being sore and having limited ROM.  Mild relief ibuprofen and tylenol.

## 2014-02-12 NOTE — ED Provider Notes (Signed)
CSN: [id number]     Arrival date & time 02/12/14  1732 History   None    Chief Complaint  Patient presents with  . Ankle Pain   (Consider location/radiation/quality/duration/timing/severity/associated sxs/prior Treatment) HPI Comments: 28 year old female presents complaining of left ankle pain for about 5 days. On Monday morning, she started to have pain in her left ankle when she will. Since that time, this pain has been constant. She has pain when she attempts any lateral motion of her ankle. She can perform dorsiflexion and plantar flexion without any difficulty. She can walk on the ankle with moderate level of pain and difficulty. She denies any known injury to the foot or ankle. She states the foot and ankle appears normal. There is no numbness in the extremity. She has no history of any problems in her ankle. She has no history of DVT or PE and no recent travel. She has a family history of DVTs but no clotting disorders  Patient is a 28 y.o. female presenting with ankle pain.  Ankle Pain Associated symptoms: no fever     Past Medical History  Diagnosis Date  . Anemia   . Blood transfusion     May 2012  . Diabetes mellitus    History reviewed. No pertinent past surgical history. Family History  Problem Relation Age of Onset  . Hypertension Paternal Aunt   . Diabetes Maternal Grandmother    History  Substance Use Topics  . Smoking status: Current Every Day Smoker -- 0.10 packs/day for 3 years    Types: Cigarettes  . Smokeless tobacco: Former Systems developer    Quit date: 07/15/2012  . Alcohol Use: No   OB History   Grav Para Term Preterm Abortions TAB SAB Ect Mult Living   0              Review of Systems  Constitutional: Negative for fever and chills.  Eyes: Negative for visual disturbance.  Respiratory: Negative for cough and shortness of breath.   Cardiovascular: Negative for chest pain, palpitations and leg swelling.  Gastrointestinal: Negative for nausea, vomiting and  abdominal pain.  Endocrine: Negative for polydipsia and polyuria.  Genitourinary: Negative for dysuria, urgency and frequency.  Musculoskeletal: Positive for arthralgias. Negative for joint swelling and myalgias.  Skin: Negative for rash.  Neurological: Negative for dizziness, weakness and light-headedness.    Allergies  Review of patient's allergies indicates no known allergies.  Home Medications   Current Outpatient Rx  Name  Route  Sig  Dispense  Refill  . albuterol (PROVENTIL HFA;VENTOLIN HFA) 108 (90 BASE) MCG/ACT inhaler   Inhalation   Inhale 2 puffs into the lungs every 4 (four) hours as needed for wheezing or shortness of breath (and or cough).   1 Inhaler   0   . albuterol (PROVENTIL HFA;VENTOLIN HFA) 108 (90 BASE) MCG/ACT inhaler   Inhalation   Inhale 1-2 puffs into the lungs every 6 (six) hours as needed for wheezing or shortness of breath.   1 Inhaler   0   . azithromycin (ZITHROMAX Z-PAK) 250 MG tablet      Take 2 tabs on day 1 then 1 tab daily on days 2-5   6 each   0   . chlorpheniramine-HYDROcodone (TUSSIONEX PENNKINETIC ER) 10-8 MG/5ML LQCR   Oral   Take 5 mLs by mouth at bedtime as needed.   50 mL   0   . etodolac (LODINE) 500 MG tablet   Oral   Take 1 tablet (  500 mg total) by mouth 2 (two) times daily.   30 tablet   1   . ferrous sulfate 325 (65 FE) MG tablet   Oral   Take 325 mg by mouth once.          . fluconazole (DIFLUCAN) 150 MG tablet      1 tab po q72h x4 doses   4 tablet   0   . EXPIRED: levonorgestrel (MIRENA) 20 MCG/24HR IUD   Intrauterine   1 Intra Uterine Device (1 each total) by Intrauterine route once.   1 each   0   . EXPIRED: metFORMIN (GLUCOPHAGE) 1000 MG tablet   Oral   Take 1,000 mg by mouth daily with breakfast.         . metFORMIN (GLUCOPHAGE) 1000 MG tablet   Oral   Take 1 tablet (1,000 mg total) by mouth 2 (two) times daily with a meal.   60 tablet   6   . Multiple Vitamin (MULTIVITAMIN WITH  MINERALS) TABS   Oral   Take 1 tablet by mouth daily.          BP 128/73  Pulse 110  Temp(Src) 98.6 F (37 C) (Oral)  Resp 20  SpO2 100% Physical Exam  Nursing note and vitals reviewed. Constitutional: She is oriented to person, place, and time. Vital signs are normal. She appears well-developed and well-nourished. No distress.  HENT:  Head: Normocephalic and atraumatic.  Cardiovascular:  Pulses:      Dorsalis pedis pulses are 2+ on the right side, and 2+ on the left side.  Pulmonary/Chest: Effort normal. No respiratory distress.  Musculoskeletal:       Left ankle: She exhibits normal range of motion. Tenderness. CF ligament tenderness found. No lateral malleolus, no medial malleolus, no AITFL, no posterior TFL, no head of 5th metatarsal and no proximal fibula tenderness found. Achilles tendon normal.       Left foot: Normal.  There is pain with on the medial ankle where inversion stress of the ankle, and pain in the lateral ankle with eversion stress  Neurological: She is alert and oriented to person, place, and time. She has normal strength. No sensory deficit. Coordination and gait normal.  Skin: Skin is warm and dry. No rash noted. She is not diaphoretic. Nails show no clubbing.  Psychiatric: She has a normal mood and affect. Judgment normal.    ED Course  Procedures (including critical care time) Labs Review Labs Reviewed - No data to display Imaging Review No results found.   MDM   1. Ankle pain    This pain does not follow any anatomic distribution of any nerves, balance, ligaments, or tendons. The ankle appears normal. Pedal pulses are 2+. Sensation is intact. After placing the patient in a cam walker boot, her symptoms were almost completely resolved and she can walk without any pain. Will place her in a boot and have her followup with orthopedics for further evaluation of this problem   Meds ordered this encounter  Medications  . etodolac (LODINE) 500 MG  tablet    Sig: Take 1 tablet (500 mg total) by mouth 2 (two) times daily.    Dispense:  30 tablet    Refill:  1    Order Specific Question:  Supervising Provider    Answer:  Ihor Gully D [place] Leamon Palau, PA-C 02/12/14 2013

## 2014-02-14 NOTE — ED Provider Notes (Signed)
Medical screening examination/treatment/procedure(s) were performed by resident physician or non-physician practitioner and as supervising physician I was immediately available for consultation/collaboration.   Pauline Good MD.   Billy Fischer, MD 02/14/14 2031

## 2015-01-05 ENCOUNTER — Telehealth: Payer: Self-pay | Admitting: *Deleted

## 2015-01-05 NOTE — Telephone Encounter (Signed)
Patient called and stated that she was seen here a few years ago and had a mirena placed. She would like for a nurse to call her back to discuss the possible correlation between the mirena and arthritis.

## 2015-01-11 NOTE — Telephone Encounter (Signed)
Spoke with Dr. Ihor Dow and she stated that to her knowledge there is no correlation between arthritis and mirena. Mychart message sent to patient with answer.

## 2015-02-27 ENCOUNTER — Emergency Department (HOSPITAL_COMMUNITY)
Admission: EM | Admit: 2015-02-27 | Discharge: 2015-02-28 | Disposition: A | Discharge status: Home / Self Care | Payer: BLUE CROSS/BLUE SHIELD | Attending: Emergency Medicine | Admitting: Emergency Medicine

## 2015-02-27 ENCOUNTER — Encounter (HOSPITAL_COMMUNITY): Payer: Self-pay | Admitting: Emergency Medicine

## 2015-02-27 DIAGNOSIS — E119 Type 2 diabetes mellitus without complications: Secondary | ICD-10-CM | POA: Diagnosis not present

## 2015-02-27 DIAGNOSIS — Z720 Tobacco use: Secondary | ICD-10-CM | POA: Diagnosis not present

## 2015-02-27 DIAGNOSIS — Z792 Long term (current) use of antibiotics: Secondary | ICD-10-CM | POA: Insufficient documentation

## 2015-02-27 DIAGNOSIS — Z791 Long term (current) use of non-steroidal anti-inflammatories (NSAID): Secondary | ICD-10-CM | POA: Diagnosis not present

## 2015-02-27 DIAGNOSIS — R002 Palpitations: Secondary | ICD-10-CM | POA: Insufficient documentation

## 2015-02-27 DIAGNOSIS — Z79899 Other long term (current) drug therapy: Secondary | ICD-10-CM | POA: Insufficient documentation

## 2015-02-27 LAB — CBC
HCT: 43.6 % (ref 36.0–46.0)
Hemoglobin: 14.7 g/dL (ref 12.0–15.0)
MCH: 27.3 pg (ref 26.0–34.0)
MCHC: 33.7 g/dL (ref 30.0–36.0)
MCV: 81 fL (ref 78.0–100.0)
PLATELETS: 256 10*3/uL (ref 150–400)
RBC: 5.38 MIL/uL — ABNORMAL HIGH (ref 3.87–5.11)
RDW: 13.2 % (ref 11.5–15.5)
WBC: 8.9 10*3/uL (ref 4.0–10.5)

## 2015-02-27 LAB — I-STAT TROPONIN, ED: Troponin i, poc: 0 ng/mL (ref 0.00–0.08)

## 2015-02-27 LAB — BASIC METABOLIC PANEL
Anion gap: 8 (ref 5–15)
BUN: 13 mg/dL (ref 6–23)
CHLORIDE: 105 mmol/L (ref 96–112)
CO2: 24 mmol/L (ref 19–32)
CREATININE: 0.78 mg/dL (ref 0.50–1.10)
Calcium: 9.2 mg/dL (ref 8.4–10.5)
Glucose, Bld: 185 mg/dL — ABNORMAL HIGH (ref 70–99)
Potassium: 3.4 mmol/L — ABNORMAL LOW (ref 3.5–5.1)
Sodium: 137 mmol/L (ref 135–145)

## 2015-02-27 MED ORDER — SODIUM CHLORIDE 0.9 % IV BOLUS (SEPSIS)
1000.0000 mL | Freq: Once | INTRAVENOUS | Status: AC
  Administered 2015-02-27: 1000 mL via INTRAVENOUS

## 2015-02-27 NOTE — ED Notes (Signed)
Pt states that she was working when she felt like she had low blood sugar because her heart was racing and she felt dizzy. States that she ate something but now she still feels like her heart is racing. Alert and oriented.

## 2015-02-27 NOTE — ED Provider Notes (Signed)
CSN: [id number]     Arrival date & time 02/27/15  1738 History   None    Chief Complaint  Patient presents with  . Palpitations  . Dizziness     (Consider location/radiation/quality/duration/timing/severity/associated sxs/prior Treatment) Patient is a 29 y.o. female presenting with palpitations and dizziness. The history is provided by the patient. No language interpreter was used.  Palpitations Palpitations quality:  Regular Onset quality:  Gradual Duration:  1 day Timing:  Constant Progression:  Worsening Chronicity:  New Context: not exercise and not hyperventilation   Relieved by:  Nothing Worsened by:  Nothing Ineffective treatments:  None tried Associated symptoms: dizziness   Risk factors: diabetes mellitus   Dizziness Associated symptoms: palpitations   Pt reports she took metformin.   Pt thought her glucose was low, She felt shaky and felt like her heart was racing.  Past Medical History  Diagnosis Date  . Anemia   . Blood transfusion     May 2012  . Diabetes mellitus    History reviewed. No pertinent past surgical history. Family History  Problem Relation Age of Onset  . Hypertension Paternal Aunt   . Diabetes Maternal Grandmother    History  Substance Use Topics  . Smoking status: Current Every Day Smoker -- 0.10 packs/day for 3 years    Types: Cigarettes  . Smokeless tobacco: Former Systems developer    Quit date: 07/15/2012  . Alcohol Use: No   OB History    Gravida Para Term Preterm AB TAB SAB Ectopic Multiple Living   0              Review of Systems  Cardiovascular: Positive for palpitations.  Neurological: Positive for dizziness.  All other systems reviewed and are negative.     Allergies  Review of patient's allergies indicates no known allergies.  Home Medications   Prior to Admission medications   Medication Sig Start Date End Date Taking? Authorizing Provider  albuterol (PROVENTIL HFA;VENTOLIN HFA) 108 (90 BASE) MCG/ACT inhaler Inhale 2  puffs into the lungs every 4 (four) hours as needed for wheezing or shortness of breath (and or cough). 03/07/13   Rhetta Mura Schorr, NP  albuterol (PROVENTIL HFA;VENTOLIN HFA) 108 (90 BASE) MCG/ACT inhaler Inhale 1-2 puffs into the lungs every 6 (six) hours as needed for wheezing or shortness of breath. 12/01/13   Janne Napoleon, NP  azithromycin (ZITHROMAX Z-PAK) 250 MG tablet Take 2 tabs on day 1 then 1 tab daily on days 2-5 03/07/13   Rhetta Mura Schorr, NP  chlorpheniramine-HYDROcodone (TUSSIONEX PENNKINETIC ER) 10-8 MG/5ML LQCR Take 5 mLs by mouth at bedtime as needed. 03/07/13   Rhetta Mura Schorr, NP  etodolac (LODINE) 500 MG tablet Take 1 tablet (500 mg total) by mouth 2 (two) times daily. 02/12/14   Liam Graham, PA-C  ferrous sulfate 325 (65 FE) MG tablet Take 325 mg by mouth once.     Historical Provider, MD  fluconazole (DIFLUCAN) 150 MG tablet 1 tab po q72h x4 doses 08/18/12   Adlih Moreno-Coll, MD  levonorgestrel (MIRENA) 20 MCG/24HR IUD 1 Intra Uterine Device (1 each total) by Intrauterine route once. 07/15/12 07/15/13  Lavonia Drafts, MD  metFORMIN (GLUCOPHAGE) 1000 MG tablet Take 1,000 mg by mouth daily with breakfast. 07/15/12 07/15/13  Lavonia Drafts, MD  metFORMIN (GLUCOPHAGE) 1000 MG tablet Take 1 tablet (1,000 mg total) by mouth 2 (two) times daily with a meal. 08/31/12   Lavonia Drafts, MD  Multiple Vitamin (MULTIVITAMIN WITH MINERALS) TABS Take 1  tablet by mouth daily.    Historical Provider, MD   BP 127/87 mmHg  Pulse 118  Temp(Src) 98 F (36.7 C) (Oral)  SpO2 99% Physical Exam  Constitutional: She is oriented to person, place, and time. She appears well-developed and well-nourished.  HENT:  Head: Normocephalic and atraumatic.  Right Ear: External ear normal.  Left Ear: External ear normal.  Nose: Nose normal.  Mouth/Throat: Oropharynx is clear and moist.  Eyes: Conjunctivae and EOM are normal. Pupils are equal, round, and reactive to light.  Neck: Normal  range of motion.  Cardiovascular: Normal rate and normal heart sounds.   Pulmonary/Chest: Effort normal and breath sounds normal.  Abdominal: Soft. She exhibits no distension.  Musculoskeletal: Normal range of motion.  Neurological: She is alert and oriented to person, place, and time.  Skin: Skin is warm.  Psychiatric: She has a normal mood and affect.  Nursing note and vitals reviewed.   ED Course  Procedures (including critical care time) Labs Review Labs Reviewed  CBC - Abnormal; Notable for the following:    RBC 5.38 (*)    All other components within normal limits  BASIC METABOLIC PANEL - Abnormal; Notable for the following:    Potassium 3.4 (*)    Glucose, Bld 185 (*)    All other components within normal limits  I-STAT TROPOININ, ED    Imaging Review No results found.   EKG Interpretation   Date/Time:  Monday February 27 2015 23:39:59 EDT Ventricular Rate:  89 PR Interval:  160 QRS Duration: 77 QT Interval:  356 QTC Calculation: 433 R Axis:   56 Text Interpretation:  Sinus rhythm Confirmed by Hazle Coca [phone]) on  02/27/2015 11:42:27 PM     Sinus tachycardia 117  Nonspecific t wave abnormality  Normal axis MDM    Final diagnoses:  Palpitation    Return if any problems.     Hollace Kinnier Walton, PA-C 02/28/15 0012  Quintella Reichert, MD 02/28/15 [phone]

## 2015-02-28 LAB — CBG MONITORING, ED: Glucose-Capillary: 168 mg/dL — ABNORMAL HIGH (ref 70–99)

## 2015-02-28 NOTE — Discharge Instructions (Signed)

## 2015-08-31 ENCOUNTER — Ambulatory Visit: Payer: BLUE CROSS/BLUE SHIELD | Admitting: Family Medicine

## 2015-09-07 ENCOUNTER — Ambulatory Visit: Payer: BLUE CROSS/BLUE SHIELD | Admitting: Medical

## 2016-03-07 ENCOUNTER — Encounter: Payer: Self-pay | Admitting: Family Medicine

## 2016-03-07 ENCOUNTER — Ambulatory Visit (INDEPENDENT_AMBULATORY_CARE_PROVIDER_SITE_OTHER): Payer: BLUE CROSS/BLUE SHIELD | Admitting: Family Medicine

## 2016-03-07 VITALS — BP 110/78 | HR 64 | Wt 230.4 lb

## 2016-03-07 DIAGNOSIS — Z720 Tobacco use: Secondary | ICD-10-CM | POA: Diagnosis not present

## 2016-03-07 DIAGNOSIS — Z7189 Other specified counseling: Secondary | ICD-10-CM

## 2016-03-07 DIAGNOSIS — Z7689 Persons encountering health services in other specified circumstances: Secondary | ICD-10-CM

## 2016-03-07 DIAGNOSIS — R0683 Snoring: Secondary | ICD-10-CM | POA: Diagnosis not present

## 2016-03-07 DIAGNOSIS — G479 Sleep disorder, unspecified: Secondary | ICD-10-CM

## 2016-03-07 DIAGNOSIS — F17200 Nicotine dependence, unspecified, uncomplicated: Secondary | ICD-10-CM

## 2016-03-07 NOTE — Progress Notes (Signed)
   Subjective:    Patient ID: Ana [name], female    DOB: 1986-10-15, 30 y.o.   MRN: [id number]  HPI Chief Complaint  Patient presents with  . consult    new pt, consult sleeping issues- stops breathing, snoring, family hx of osa   She is new to the practice and here for an acute complaint. No CPE in years.  She states she is always exhausted and snores, other people have told her that she stops breathing. States this has been going on for a few years. She has a family history of sleep apnea, both parents. States she would like to be tested.   Would like to stop smoking. Has been smoking 5 per day for about 10 years. Has tried to stop cold [place] in past 5-6 times. Stopped for 3 months in past.   She states she has a several year history of occasional heart palpitations and left upper chest pain. She has been evaluated in the ED for this pain. No pain with exertion. She notices this more when she is sitting still or laying down at night. States pain feels like a dull ache. Usually lasts about 30 seconds. She Denies chest pain today, shortness of breath, lower extremity edema, cough. Also denies fever, chills, unexplained weight change, nausea, vomiting, diarrhea.  Mirena IUD- 4 years. Has this for heavy bleeding.  Goes to [facility] clinic for this. Would like a regular gynecologist referral.   Takes vitamins, takes vitamin B12 because she is tired all the time. Fish oil. Magnesium.   States she has been seen for ADD but not officially diagnosed.  Pre-diabetes history.  Last pap smear: 4 years ago.   Mother with Hashimotos, depression, alcoholism. Father- sleep apnea. Diabetes and arthritis in grandparents.   No immunizations on file. States last tetanus is 2015.   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  Lives with a roommate.      Review of Systems Pertinent positives and negatives in the history of present illness.     Objective:   Physical Exam BP 110/78 mmHg   Pulse 64  Wt 230 lb 6.4 oz (104.509 kg)  Alert and in no distress. Pharyngeal area is normal. Neck is supple without adenopathy or thyromegaly. Cardiac exam shows a regular sinus rhythm without murmurs or gallops. Lungs are clear to auscultation. Extremities without edema, pulses intact.  Epworth sleepiness scale 12    Assessment & Plan:  Sleep disturbance  Snoring  Current smoker  Encounter to establish care  Discussed that the pain in her chest and palpitations that she occasionally experiences have been unchanged and ongoing for a couple of years which speaks to this being nothing serious. She has been ruled out for cardiac etiology in the emergency department and states the pain is not any different from when she was there. Order placed for home sleep study. Information provided to stop smoking. Recommend that she return for complete physical exam and fasting blood work.

## 2016-03-07 NOTE — Patient Instructions (Addendum)
Please try and quit smoking--start thinking about why/when you smoke (habit, boredom, stress) in order to come up with effective strategies to cut back or quit. Available resources to help you quit include free counseling through [facility] ([place]uitline[facility] or 1-800-QUITNOW), smoking cessation classes through [facility] (call to find out schedule), over-the-counter nicotine replacements, and e-cigarettes (although this may not help break the hand-mouth habit).  Many insurance companies also have smoking cessation programs (which may decrease the cost of patches, meds if enrolled).  If these methods are not effective for you, and you are motivated to quit, return to discuss the possibility of prescription medications.  I am referring you for a sleep study. They will call you to schedule this.  Return for a complete physical exam at your convenience. You will need to be fasting for 6-8 hours for this.

## 2016-03-14 ENCOUNTER — Telehealth: Payer: Self-pay | Admitting: Obstetrics & Gynecology

## 2016-03-14 ENCOUNTER — Encounter: Payer: Self-pay | Admitting: Family Medicine

## 2016-03-14 ENCOUNTER — Ambulatory Visit (INDEPENDENT_AMBULATORY_CARE_PROVIDER_SITE_OTHER): Payer: BLUE CROSS/BLUE SHIELD | Admitting: Family Medicine

## 2016-03-14 ENCOUNTER — Other Ambulatory Visit: Payer: Self-pay | Admitting: Family Medicine

## 2016-03-14 VITALS — BP 116/78 | HR 68 | Ht 66.0 in | Wt 227.6 lb

## 2016-03-14 DIAGNOSIS — R5383 Other fatigue: Secondary | ICD-10-CM

## 2016-03-14 DIAGNOSIS — D2239 Melanocytic nevi of other parts of face: Secondary | ICD-10-CM | POA: Diagnosis not present

## 2016-03-14 DIAGNOSIS — Z124 Encounter for screening for malignant neoplasm of cervix: Secondary | ICD-10-CM | POA: Diagnosis not present

## 2016-03-14 DIAGNOSIS — Z8349 Family history of other endocrine, nutritional and metabolic diseases: Secondary | ICD-10-CM

## 2016-03-14 DIAGNOSIS — Z1283 Encounter for screening for malignant neoplasm of skin: Secondary | ICD-10-CM

## 2016-03-14 DIAGNOSIS — Z9889 Other specified postprocedural states: Secondary | ICD-10-CM

## 2016-03-14 DIAGNOSIS — Z113 Encounter for screening for infections with a predominantly sexual mode of transmission: Secondary | ICD-10-CM | POA: Diagnosis not present

## 2016-03-14 DIAGNOSIS — Z13220 Encounter for screening for lipoid disorders: Secondary | ICD-10-CM

## 2016-03-14 DIAGNOSIS — Z862 Personal history of diseases of the blood and blood-forming organs and certain disorders involving the immune mechanism: Secondary | ICD-10-CM | POA: Diagnosis not present

## 2016-03-14 DIAGNOSIS — Z0000 Encounter for general adult medical examination without abnormal findings: Secondary | ICD-10-CM | POA: Diagnosis not present

## 2016-03-14 DIAGNOSIS — Z8742 Personal history of other diseases of the female genital tract: Secondary | ICD-10-CM

## 2016-03-14 DIAGNOSIS — D2230 Melanocytic nevi of unspecified part of face: Secondary | ICD-10-CM

## 2016-03-14 LAB — POCT URINALYSIS DIPSTICK
Bilirubin, UA: NEGATIVE
Blood, UA: NEGATIVE
Glucose, UA: NEGATIVE
KETONES UA: NEGATIVE
LEUKOCYTES UA: NEGATIVE
Nitrite, UA: NEGATIVE
PH UA: 6
Protein, UA: NEGATIVE
SPEC GRAV UA: 1.025
Urobilinogen, UA: NEGATIVE

## 2016-03-14 NOTE — Progress Notes (Signed)
Subjective:    Patient ID: Ana [name], female    DOB: 02-21-1986, 30 y.o.   MRN: [id number]  HPI Chief Complaint  Patient presents with  . cpe    fasting cpe. no pap   She is here for a complete physical exam and fasting blood work. She states she quit smoking 4 days ago. She is using Nicorette gum.  No CPE in years.   Complains of fatigue, always feels tired and attributes this to possibly having sleep apnea. She has been referred for sleep study but has not heard from them yet.  Would like to stop smoking. Has been smoking 5 per day for about 10 years. Has tried to stop cold [place] in past 5-6 times. Stopped for 3 months in past.   She denies chest pain, shortness of breath, lower extremity edema, cough. Also denies fever, chills, unexplained weight change, nausea, vomiting, diarrhea.  Mirena IUD- 4 years. Has this for heavy bleeding and cervical polyp. Goes to [facility] clinic for this. Would like a regular gynecologist referral. She appears bothered by menstrual cycles and states she would like to have a hysterectomy because she is tired of having cycles.  LMP: 03/10/2015 Last pap smear: 4 years ago. She would like STD testing today. Reports pre-diabetes history, PCOS has been mentioned but states she was not officially diagnosed with this.   Mammogram: never Eye exam: January 2016- prescription glasses Colonoscopy: never  Takes vitamins, takes vitamin B12 because she is tired all the time. Fish oil. Magnesium.   Mother with Hashimotos, depression, alcoholism. Father- sleep apnea. Diabetes and arthritis in grandparents. MGM- stroke after surgery 89 yrs old  No immunizations on file. States last tetanus is 2015.   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  Lives with a roommate, female sexual partner.   Wears sunscreen, always wears seatbelt, smoke detectors in home, no guns in home.    Review of Systems Review of Systems Constitutional: -fever, -chills,  -sweats, -unexpected weight change,+fatigue ENT: +runny nose, -ear pain, -sore throat Cardiology:  -chest pain, -palpitations, -edema Respiratory: -cough, -shortness of breath, -wheezing Gastroenterology: -abdominal pain, -nausea, -vomiting, -diarrhea, -constipation  Hematology: -bleeding or bruising problems Musculoskeletal: -arthralgias, -myalgias, -joint swelling, -back pain Ophthalmology: -vision changes Urology: -dysuria, -difficulty urinating, -hematuria, -urinary frequency, -urgency Neurology: -headache, -weakness, -tingling, -numbness       Objective:   Physical Exam BP 116/78 mmHg  Pulse 68  Ht 5\' 6"  (1.676 m)  Wt 227 lb 9.6 oz (103.239 kg)  BMI 36.75 kg/m2  General Appearance:    Alert, cooperative, no distress, appears stated age  Head:    Normocephalic, without obvious abnormality, atraumatic  Eyes:    PERRL, conjunctiva/corneas clear, EOM's intact, fundi    benign  Ears:    Normal TM's and external ear canals  Nose:   Nares normal, mucosa normal, no drainage or sinus   tenderness  Throat:   Lips, mucosa, and tongue normal; teeth and gums normal  Neck:   Supple, no lymphadenopathy;  thyroid:  no   enlargement/tenderness/nodules; no carotid   bruit or JVD  Back:    Spine nontender, no curvature, ROM normal, no CVA     tenderness  Lungs:     Clear to auscultation bilaterally without wheezes, rales or     ronchi; respirations unlabored  Chest Wall:    No tenderness or deformity   Heart:    Regular rate and rhythm, S1 and S2 normal, no murmur, rub  or gallop  Breast Exam:    Not performed. Requests referral to gynecologist. No axillary lymphadenopathy  Abdomen:     Soft, non-tender, nondistended, normoactive bowel sounds,    no masses, no hepatosplenomegaly  Genitalia:    Not performed. Requests gynecologist referral   Rectal:    Not performed due to age<40 and no related complaints  Extremities:   No clubbing, cyanosis or edema  Pulses:   2+ and symmetric all  extremities  Skin:   Skin color, texture, turgor normal, no rashes or lesions, dark, raised brown nevi to right chin, symmetric. Multiple nevi and skin tags, axillary and right upper abdomen.   Lymph nodes:   Cervical, supraclavicular, and axillary nodes normal  Neurologic:   CNII-XII intact, normal strength, sensation and gait; reflexes 2+ and symmetric throughout          Psych:   Normal mood, affect, hygiene and grooming.    Urinalysis dipstick: negative      Assessment & Plan:  Routine general medical examination at a health care facility - Plan: CBC with Differential/Platelet, Comprehensive metabolic panel, POCT urinalysis dipstick  Screening for lipid disorders - Plan: Lipid panel  Family history of Hashimoto thyroiditis - Plan: TSH  History of PCOS - Plan: Ambulatory referral to Gynecology  Screening for cervical cancer - Plan: Ambulatory referral to Gynecology  Other fatigue - Plan: TSH  Screening for STD (sexually transmitted disease) - Plan: RPR, HIV antibody, GC/chlamydia probe amp, urine  History of anemia - Plan: CBC with Differential/Platelet  History of cervical polypectomy - Plan: Ambulatory referral to Gynecology  Skin exam, screening for cancer - Plan: Ambulatory referral to Dermatology  Nevus of face - Plan: Ambulatory referral to Dermatology  Congratulated her on stopping smoking. Discussed making modifications to lifestyle that she associated with smoking.  She is walking to work now instead of driving because she states when she would drive to work she would smoke. Encouraged her to chew gum, suck on a straw or take deep breaths. Provided written instructions for stopping smoking.  Suspect that fatigue is related to possible sleep apnea. She has been referred for a sleep study. Also discussed fatigue and that we will check labs to look for underlying etiologies.  Discussed safety and health promotion. She is up to date on immunizations per patient, no records  on file.  Recommend eating healthy diet with more whole grains fruits and vegetables and getting a minimum of 150 minutes of exercise per week. STI testing performed.  Referral made to Gynecologist for further evaluation due to history of cervical polyp, possible PCOS, and concerns regarding desire for possible hysterectomy.  Referral made to dermatologist for nevus of face and per patient request for a full skin check. An appointment was made for March 26, 2016.  Will follow up pending labs.

## 2016-03-14 NOTE — Telephone Encounter (Signed)
Called and left a message for patient to call back to schedule a new patient doctor referral. °

## 2016-03-14 NOTE — Patient Instructions (Addendum)
Congratulations on stopping smoking!   You should receive a phone call from the gynecology office, [facility] women's health, and from the sleep Center. If you have not heard from these offices by next Thursday, give [place] a call. You are scheduled for an appointment at Dr. Allyson Sabal, dermatologist, on April 18th at 2:20 pm. Arrive by 2:10 pm.  We will call you with lab results.    Available resources to help you quit include free counseling through [facility] ([place]Quitline.[facility] or 1-800-QUITNOW), smoking cessation classes through [facility] (call to find out schedule), over-the-counter nicotine replacements, and e-cigarettes (although this may not help break the hand-mouth habit).  Many insurance companies also have smoking cessation programs (which may decrease the cost of patches, meds if enrolled).    Preventative Care for Adults - Female      MAINTAIN REGULAR HEALTH EXAMS:  A routine yearly physical is a good way to check in with your primary care provider about your health and preventive screening. It is also an opportunity to share updates about your health and any concerns you have, and receive a thorough all-over exam.   Most health insurance companies pay for at least some preventative services.  Check with your health plan for specific coverages.  WHAT PREVENTATIVE SERVICES DO WOMEN NEED?  Adult women should have their weight and blood pressure checked regularly.   Women age 30 and older should have their cholesterol levels checked regularly.  Women should be screened for cervical cancer with a Pap smear and pelvic exam beginning at either age 30, or 3 years after they become sexually activity.    Breast cancer screening generally begins at age 30 with a mammogram and breast exam by your primary care provider.    Beginning at age 30 and continuing to age 20, women should be screened for colorectal cancer.  Certain people may need continued testing until age  78.  Updating vaccinations is part of preventative care.  Vaccinations help protect against diseases such as the flu.  Osteoporosis is a disease in which the bones lose minerals and strength as we age. Women ages 81 and over should discuss this with their caregivers, as should women after menopause who have other risk factors.  Lab tests are generally done as part of preventative care to screen for anemia and blood disorders, to screen for problems with the kidneys and liver, to screen for bladder problems, to check blood sugar, and to check your cholesterol level.  Preventative services generally include counseling about diet, exercise, avoiding tobacco, drugs, excessive alcohol consumption, and sexually transmitted infections.    GENERAL RECOMMENDATIONS FOR GOOD HEALTH:  Healthy diet:  Eat a variety of foods, including fruit, vegetables, animal or vegetable protein, such as meat, fish, chicken, and eggs, or beans, lentils, tofu, and grains, such as rice.  Drink plenty of water daily.  Decrease saturated fat in the diet, avoid lots of red meat, processed foods, sweets, fast foods, and fried foods.  Exercise:  Aerobic exercise helps maintain good heart health. At least 30-40 minutes of moderate-intensity exercise is recommended. For example, a brisk walk that increases your heart rate and breathing. This should be done on most days of the week.   Find a type of exercise or a variety of exercises that you enjoy so that it becomes a part of your daily life.  Examples are running, walking, swimming, water aerobics, and biking.  For motivation and support, explore group exercise such as aerobic class,  spin class, Zumba, Yoga,or  martial arts, etc.    Set exercise goals for yourself, such as a certain weight goal, walk or run in a race such as a 5k walk/run.  Speak to your primary care provider about exercise goals.  Disease prevention:  If you smoke or chew tobacco, find out from your  caregiver how to quit. It can literally save your life, no matter how long you have been a tobacco user. If you do not use tobacco, never begin.   Maintain a healthy diet and normal weight. Increased weight leads to problems with blood pressure and diabetes.   The Body Mass Index or BMI is a way of measuring how much of your body is fat. Having a BMI above 27 increases the risk of heart disease, diabetes, hypertension, stroke and other problems related to obesity. Your caregiver can help determine your BMI and based on it develop an exercise and dietary program to help you achieve or maintain this important measurement at a healthful level.  High blood pressure causes heart and blood vessel problems.  Persistent high blood pressure should be treated with medicine if weight loss and exercise do not work.   Fat and cholesterol leaves deposits in your arteries that can block them. This causes heart disease and vessel disease elsewhere in your body.  If your cholesterol is found to be high, or if you have heart disease or certain other medical conditions, then you may need to have your cholesterol monitored frequently and be treated with medication.   Ask if you should have a cardiac stress test if your history suggests this. A stress test is a test done on a treadmill that looks for heart disease. This test can find disease prior to there being a problem.  Menopause can be associated with physical symptoms and risks. Hormone replacement therapy is available to decrease these. You should talk to your caregiver about whether starting or continuing to take hormones is right for you.   Osteoporosis is a disease in which the bones lose minerals and strength as we age. This can result in serious bone fractures. Risk of osteoporosis can be identified using a bone density scan. Women ages 30 and over should discuss this with their caregivers, as should women after menopause who have other risk factors. Ask your  caregiver whether you should be taking a calcium supplement and Vitamin D, to reduce the rate of osteoporosis.   Avoid drinking alcohol in excess (more than two drinks per day).  Avoid use of street drugs. Do not share needles with anyone. Ask for professional help if you need assistance or instructions on stopping the use of alcohol, cigarettes, and/or drugs.  Brush your teeth twice a day with fluoride toothpaste, and floss once a day. Good oral hygiene prevents tooth decay and gum disease. The problems can be painful, unattractive, and can cause other health problems. Visit your dentist for a routine oral and dental check up and preventive care every 6-12 months.   Look at your skin regularly.  Use a mirror to look at your back. Notify your caregivers of changes in moles, especially if there are changes in shapes, colors, a size larger than a pencil eraser, an irregular border, or development of new moles.  Safety:  Use seatbelts 100% of the time, whether driving or as a passenger.  Use safety devices such as hearing protection if you work in environments with loud noise or significant background noise.  Use safety glasses  when doing any work that could send debris in to the eyes.  Use a helmet if you ride a bike or motorcycle.  Use appropriate safety gear for contact sports.  Talk to your caregiver about gun safety.  Use sunscreen with a SPF (or skin protection factor) of 15 or greater.  Lighter skinned people are at a greater risk of skin cancer. Don't forget to also wear sunglasses in order to protect your eyes from too much damaging sunlight. Damaging sunlight can accelerate cataract formation.   Practice safe sex. Use condoms. Condoms are used for birth control and to help reduce the spread of sexually transmitted infections (or STIs).  Some of the STIs are gonorrhea (the clap), chlamydia, syphilis, trichomonas, herpes, HPV (human papilloma virus) and HIV (human immunodeficiency virus) which  causes AIDS. The herpes, HIV and HPV are viral illnesses that have no cure. These can result in disability, cancer and death.   Keep carbon monoxide and smoke detectors in your home functioning at all times. Change the batteries every 6 months or use a model that plugs into the wall.   Vaccinations:  Stay up to date with your tetanus shots and other required immunizations. You should have a booster for tetanus every 10 years. Be sure to get your flu shot every year, since 5%-20% of the [place] population comes down with the flu. The flu vaccine changes each year, so being vaccinated once is not enough. Get your shot in the fall, before the flu season peaks.   Other vaccines to consider:  Human Papilloma Virus or HPV causes cancer of the cervix, and other infections that can be transmitted from person to person. There is a vaccine for HPV, and females should get immunized between the ages of 40 and 57. It requires a series of 3 shots.   Pneumococcal vaccine to protect against certain types of pneumonia.  This is normally recommended for adults age 29 or older.  However, adults younger than 30 years old with certain underlying conditions such as diabetes, heart or lung disease should also receive the vaccine.  Shingles vaccine to protect against Varicella Zoster if you are older than age 64, or younger than 30 years old with certain underlying illness.  Hepatitis A vaccine to protect against a form of infection of the liver by a virus acquired from food.  Hepatitis B vaccine to protect against a form of infection of the liver by a virus acquired from blood or body fluids, particularly if you work in health care.  If you plan to travel internationally, check with your local health department for specific vaccination recommendations.  Cancer Screening:  Breast cancer screening is essential to preventive care for women. All women age 3 and older should perform a breast self-exam every month. At age  34 and older, women should have their caregiver complete a breast exam each year. Women at ages 35 and older should have a mammogram (x-ray film) of the breasts. Your caregiver can discuss how often you need mammograms.    Cervical cancer screening includes taking a Pap smear (sample of cells examined under a microscope) from the cervix (end of the uterus). It also includes testing for HPV (Human Papilloma Virus, which can cause cervical cancer). Screening and a pelvic exam should begin at age 57, or 3 years after a woman becomes sexually active. Screening should occur every year, with a Pap smear but no HPV testing, up to age 26. After age 75, you should have a  Pap smear every 3 years with HPV testing, if no HPV was found previously.   Most routine colon cancer screening begins at the age of 54. On a yearly basis, doctors may provide special easy to use take-home tests to check for hidden blood in the stool. Sigmoidoscopy or colonoscopy can detect the earliest forms of colon cancer and is life saving. These tests use a small camera at the end of a tube to directly examine the colon. Speak to your caregiver about this at age 43, when routine screening begins (and is repeated every 5 years unless early forms of pre-cancerous polyps or small growths are found).

## 2016-03-15 LAB — COMPREHENSIVE METABOLIC PANEL
ALT: 66 U/L — AB (ref 6–29)
AST: 41 U/L — AB (ref 10–30)
Albumin: 4.2 g/dL (ref 3.6–5.1)
Alkaline Phosphatase: 40 U/L (ref 33–115)
BILIRUBIN TOTAL: 0.9 mg/dL (ref 0.2–1.2)
BUN: 9 mg/dL (ref 7–25)
CO2: 24 mmol/L (ref 20–31)
Calcium: 9.5 mg/dL (ref 8.6–10.2)
Chloride: 104 mmol/L (ref 98–110)
Creat: 0.75 mg/dL (ref 0.50–1.10)
GLUCOSE: 76 mg/dL (ref 65–99)
Potassium: 4.2 mmol/L (ref 3.5–5.3)
Sodium: 140 mmol/L (ref 135–146)
Total Protein: 7.2 g/dL (ref 6.1–8.1)

## 2016-03-15 LAB — CBC WITH DIFFERENTIAL/PLATELET
Basophils Absolute: 0 cells/uL (ref 0–200)
Basophils Relative: 0 %
EOS ABS: 170 {cells}/uL (ref 15–500)
Eosinophils Relative: 2 %
HCT: 43.9 % (ref 35.0–45.0)
HEMOGLOBIN: 14.9 g/dL (ref 11.7–15.5)
LYMPHS PCT: 38 %
Lymphs Abs: 3230 cells/uL (ref 850–3900)
MCH: 28.3 pg (ref 27.0–33.0)
MCHC: 33.9 g/dL (ref 32.0–36.0)
MCV: 83.5 fL (ref 80.0–100.0)
MONO ABS: 425 {cells}/uL (ref 200–950)
MPV: 9.6 fL (ref 7.5–12.5)
Monocytes Relative: 5 %
Neutro Abs: 4675 cells/uL (ref 1500–7800)
Neutrophils Relative %: 55 %
Platelets: 254 10*3/uL (ref 140–400)
RBC: 5.26 MIL/uL — AB (ref 3.80–5.10)
RDW: 13.8 % (ref 11.0–15.0)
WBC: 8.5 10*3/uL (ref 4.0–10.5)

## 2016-03-15 LAB — LIPID PANEL
CHOL/HDL RATIO: 6 ratio — AB (ref <=5.0)
Cholesterol: 173 mg/dL (ref 125–200)
HDL: 29 mg/dL — ABNORMAL LOW (ref >=46)
LDL Cholesterol: 108 mg/dL (ref <130)
Triglycerides: 182 mg/dL — ABNORMAL HIGH (ref <150)
VLDL: 36 mg/dL — AB (ref <30)

## 2016-03-15 LAB — HIV ANTIBODY (ROUTINE TESTING W REFLEX): HIV 1&2 Ab, 4th Generation: NONREACTIVE

## 2016-03-15 LAB — HEPATITIS PANEL, ACUTE
HCV Ab: NEGATIVE
Hep A IgM: NONREACTIVE
Hep B C IgM: NONREACTIVE
Hepatitis B Surface Ag: NEGATIVE

## 2016-03-15 LAB — GC/CHLAMYDIA PROBE AMP
CT Probe RNA: NOT DETECTED
GC PROBE AMP APTIMA: NOT DETECTED

## 2016-03-15 LAB — TSH: TSH: 1.46 m[IU]/L

## 2016-03-15 LAB — RPR

## 2016-03-15 NOTE — Telephone Encounter (Signed)
Called and left a message for patient to call back to schedule a new patient doctor referral. °

## 2016-03-26 ENCOUNTER — Telehealth: Payer: Self-pay | Admitting: Family Medicine

## 2016-03-26 NOTE — Telephone Encounter (Signed)
Please check on this.

## 2016-03-26 NOTE — Telephone Encounter (Signed)
Pt called to check on status of sleep study. She states she hasnt heard anything. Please call pt at [phone].

## 2016-03-27 NOTE — Telephone Encounter (Signed)
Spoke to [facility] sleep center and they are waiting on [place] to authorize pt's sleep study test. Pt was notified

## 2016-04-01 ENCOUNTER — Encounter: Payer: Self-pay | Admitting: Obstetrics and Gynecology

## 2016-04-01 ENCOUNTER — Ambulatory Visit (INDEPENDENT_AMBULATORY_CARE_PROVIDER_SITE_OTHER): Payer: BLUE CROSS/BLUE SHIELD | Admitting: Obstetrics and Gynecology

## 2016-04-01 VITALS — BP 112/60 | HR 88 | Resp 16 | Ht 66.0 in | Wt 223.0 lb

## 2016-04-01 DIAGNOSIS — N939 Abnormal uterine and vaginal bleeding, unspecified: Secondary | ICD-10-CM | POA: Diagnosis not present

## 2016-04-01 DIAGNOSIS — R102 Pelvic and perineal pain: Secondary | ICD-10-CM

## 2016-04-01 DIAGNOSIS — Z30431 Encounter for routine checking of intrauterine contraceptive device: Secondary | ICD-10-CM | POA: Diagnosis not present

## 2016-04-01 DIAGNOSIS — Z01419 Encounter for gynecological examination (general) (routine) without abnormal findings: Secondary | ICD-10-CM

## 2016-04-01 DIAGNOSIS — Z124 Encounter for screening for malignant neoplasm of cervix: Secondary | ICD-10-CM

## 2016-04-01 NOTE — Progress Notes (Signed)
30 y.o. G0P0 SingleCaucasianF here for annual exam.  The patient has a h/o PCOS and has a mirena IUD (placed in 8/13). She has intermittent bleeding with the mirena, bleeds x 2-3 days. Always occurs with exercise or sex. She has been avoiding sex and exercise because the bleeding is stressful for her. She can saturate a pad in 2-3 hours. She does c/o cramps with bleeding, can occur without cycles. She c/o intermittent severe pain in her "ovaries", occurs 1-2 x a week. The pain is sharp and lasts 30 seconds to 40 minutes. She has 2-3 BM's a day, slightly loose  She is sexually active without penetration, female partner. No h/o female partners. She would like to have kids, but not with her body. She states her gender identity is more female. She would like to have her breast removed in the future.  She has talked to a therapist about her PTSD. She has panic attacks when she bleeds. She would like to have a hysterectomy. In the past she bleed for 3-4 months at a time, prior to her IUD. She needed a blood transfusion and a D&C in 2012-2013, was hospitalized x 5 days. She has some PTSD in relation to her prior bleeding and hospitalization. When she would get her cycle she would go through >1pad/hour, passed large clots.  She is working at weight loss, down 15 lbs in 3 months. Down 7 lbs in the last 3 weeks.     Patient's last menstrual period was 02/29/2016 (exact date).          Sexually active: Yes.    The current method of family planning is IUD.    Exercising: Yes.    Intense work schedule; English as a second language teacher Smoker:  Yes, 1-2 cigarettes a day, in the process of quitting   Health Maintenance: Pap:  07/06/2012 WNL History of abnormal Pap:  no MMG:  None Colonoscopy:  None BMD:   None TDaP:  2015? Gardasil: Never   reports that she has been smoking Cigarettes.  She has a .3 pack-year smoking history. She quit smokeless tobacco use about 3 weeks ago. She reports that she does not drink alcohol or use  illicit drugs. Lives alone, goes to school, has a serious girlfriend, plans to get married. 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   Past Medical History  Diagnosis Date  . Anemia   . Blood transfusion     May 2012  . Prediabetes   . Blood transfusion without reported diagnosis   . Lipids abnormal     History reviewed. No pertinent past surgical history.  Current Outpatient Prescriptions  Medication Sig Dispense Refill  . Cyanocobalamin (B-12 PO) Take by mouth.    . levonorgestrel (MIRENA) 20 MCG/24HR IUD 1 each by Intrauterine route once.    Marland Kitchen MAGNESIUM ASPARTATE PO Take by mouth.    . Multiple Vitamins-Minerals (MULTIVITAMIN GUMMIES ADULT) CHEW Chew 1 tablet by mouth daily.    . Omega-3 Fatty Acids (FISH OIL PO) Take by mouth.     No current facility-administered medications for this visit.    Family History  Problem Relation Age of Onset  . Hypertension Paternal Aunt   . Heart Problems Maternal Grandmother   . Hashimoto's thyroiditis Mother   . Depression Mother   . Alcohol abuse Mother   . Sleep apnea Father   . Diabetes Maternal Grandfather     Review of Systems  Constitutional: Negative.   HENT: Negative.  Eyes: Negative.   Respiratory: Negative.   Cardiovascular: Negative.   Gastrointestinal: Negative.   Endocrine: Negative.   Genitourinary: Positive for vaginal bleeding, vaginal pain, menstrual problem and pelvic pain.  Musculoskeletal: Negative.   Allergic/Immunologic: Negative.   Neurological: Negative.   Hematological: Negative.   Psychiatric/Behavioral: Negative.     Exam:   BP 112/60 mmHg  Pulse 88  Resp 16  Ht 5\' 6"  (1.676 m)  Wt 223 lb (101.152 kg)  BMI 36.01 kg/m2  LMP 02/29/2016 (Exact Date)  Weight change: @WEIGHTCHANGE @ Height:   Height: 5\' 6"  (167.6 cm)  Ht Readings from Last 3 Encounters:  04/01/16 5\' 6"  (1.676 m)  03/14/16 5\' 6"  (1.676 m)  08/31/12 5\' 5"  (1.651 m)    General appearance: alert,  cooperative and appears stated age Head: Normocephalic, without obvious abnormality, atraumatic Neck: no adenopathy, supple, symmetrical, trachea midline and thyroid normal to inspection and palpation Lungs: clear to auscultation bilaterally Breasts: normal appearance, no masses or tenderness Heart: regular rate and rhythm Abdomen: soft, non-tender; bowel sounds normal; no masses,  no organomegaly Extremities: extremities normal, atraumatic, no cyanosis or edema Skin: Skin color, texture, turgor normal. No rashes or lesions Lymph nodes: Cervical, supraclavicular, and axillary nodes normal. No abnormal inguinal nodes palpated Neurologic: Grossly normal   Pelvic: External genitalia:  no lesions              Urethra:  normal appearing urethra with no masses, tenderness or lesions              Bartholins and Skenes: normal                 Vagina: normal appearing vagina with normal color and discharge, no lesions              Cervix: no lesions and IUD strings 3 cm               Bimanual Exam:  Uterus:  normal size, contour, position, consistency, mobility, non-tender and anteverted              Adnexa: no mass, fullness, tenderness               Rectovaginal: Confirms               Anus:  normal sphincter tone, no lesions  Chaperone was present for exam.  A:  Well Woman with normal exam  H/O PCOS and abnormal uterine bleeding  Mirena IUD for cycle control, huge help. She still has panic attacks with onset of bleeding, has seen a therapist  Abdominal/pelvic pain, normal exam. Discussed pain could be GI, frequent, loose stools  P:   Pap with reflex hpv  Return for a pelvic ultrasound  Primary does labs and immunizations  Discussed breast self exam  Discussed calcium and vit D intake  Return for a gyn ultrasound  Discussed the option of hysterectomy, will continue with the IUD for now.

## 2016-04-01 NOTE — Patient Instructions (Signed)
Polycystic Ovarian Syndrome  Polycystic ovarian syndrome (PCOS) is a common hormonal disorder among women of reproductive age. Most women with PCOS grow many small cysts on their ovaries. PCOS can cause problems with your periods and make it difficult to get pregnant. It can also cause an increased risk of miscarriage with pregnancy. If left untreated, PCOS can lead to serious health problems, such as diabetes and heart disease.  CAUSES  The cause of PCOS is not fully understood, but genetics may be a factor.  SIGNS AND SYMPTOMS   · Infrequent or no menstrual periods.    · Inability to get pregnant (infertility) because of not ovulating.    · Increased growth of hair on the face, chest, stomach, back, thumbs, thighs, or toes.    · Acne, oily skin, or dandruff.    · Pelvic pain.    · Weight gain or obesity, usually carrying extra weight around the waist.    · Type 2 diabetes.     · High cholesterol.    · High blood pressure.    · Female-pattern baldness or thinning hair.    · Patches of thickened and dark brown or black skin on the neck, arms, breasts, or thighs.    · Tiny excess flaps of skin (skin tags) in the armpits or neck area.    · Excessive snoring and having breathing stop at times while asleep (sleep apnea).    · Deepening of the voice.    · Gestational diabetes when pregnant.    DIAGNOSIS   There is no single test to diagnose PCOS.   · Your health care provider will:      Take a medical history.      Perform a pelvic exam.      Have ultrasonography done.      Check your female and female hormone levels.      Measure glucose or sugar levels in the blood.      Do other blood tests.    · If you are producing too many female hormones, your health care provider will make sure it is from PCOS. At the physical exam, your health care provider will want to evaluate the areas of increased hair growth. Try to allow natural hair growth for a few days before the visit.    · During a pelvic exam, the ovaries may be enlarged  or swollen because of the increased number of small cysts. This can be seen more easily by using vaginal ultrasonography or screening to examine the ovaries and lining of the uterus (endometrium) for cysts. The uterine lining may become thicker if you have not been having a regular period.    TREATMENT   Because there is no cure for PCOS, it needs to be managed to prevent problems. Treatments are based on your symptoms. Treatment is also based on whether you want to have a baby or whether you need contraception.   Treatment may include:   · Progesterone hormone to start a menstrual period.    · Birth control pills to make you have regular menstrual periods.    · Medicines to make you ovulate, if you want to get pregnant.    · Medicines to control your insulin.    · Medicine to control your blood pressure.    · Medicine and diet to control your high cholesterol and triglycerides in your blood.  · Medicine to reduce excessive hair growth.   · Surgery, making small holes in the ovary, to decrease the amount of female hormone production. This is done through a long, lighted tube (laparoscope) placed into the pelvis through a tiny incision in the lower abdomen.      HOME CARE INSTRUCTIONS  · Only take over-the-counter or prescription medicine as directed by your health care provider.  · Pay attention to the foods you eat and your activity levels. This can help reduce the effects of PCOS.    Keep your weight under control.    Eat foods that are low in carbohydrate and high in fiber.    Exercise regularly.  SEEK MEDICAL CARE IF:  · Your symptoms do not get better with medicine.  · You have new symptoms.     This information is not intended to replace advice given to you by your health care provider. Make sure you discuss any questions you have with your health care provider.     Document Released: 03/21/2005 Document Revised: 09/15/2013 Document Reviewed: 05/13/2013  Elsevier Interactive Patient Education ©2016 Elsevier  Inc.

## 2016-04-02 LAB — IPS PAP TEST WITH REFLEX TO HPV

## 2016-04-05 ENCOUNTER — Telehealth: Payer: Self-pay | Admitting: Family Medicine

## 2016-04-05 DIAGNOSIS — R0683 Snoring: Secondary | ICD-10-CM

## 2016-04-05 DIAGNOSIS — G479 Sleep disorder, unspecified: Secondary | ICD-10-CM

## 2016-04-05 NOTE — Telephone Encounter (Signed)
Pt called stating that [place] informed her that sleep study has been approved so pt wanted [facility] sleep study phone number to call them to f/u on scheduling & to make sure that they knew that it was approved by [employer]. Pt called right back stating that [facility] told her that her that [place] will only cover In Lab Study and we will need to set that up and change the referral to be an In Lab Study

## 2016-04-08 NOTE — Telephone Encounter (Signed)
Put in split night study

## 2016-04-10 ENCOUNTER — Telehealth: Payer: Self-pay

## 2016-04-10 DIAGNOSIS — R102 Pelvic and perineal pain: Secondary | ICD-10-CM

## 2016-04-10 MED ORDER — METRONIDAZOLE 500 MG PO TABS: 500.0000 mg | ORAL_TABLET | Freq: Two times a day (BID) | ORAL | Status: DC

## 2016-04-10 MED ORDER — PENICILLIN V POTASSIUM 500 MG PO TABS
500.0000 mg | ORAL_TABLET | Freq: Four times a day (QID) | ORAL | Status: DC
Start: 2016-04-10 — End: 2016-05-21

## 2016-04-10 NOTE — Telephone Encounter (Signed)
Spoke with patient. Advised of message and results as seen below from [place]. She is agreeable and verbalizes understanding. Rx for Flagyl 500 mg bid x 7 days #14 0RF sent to pharmacy on file. ETOH precautions given. Rx for Penicillin V 00 mg po q 6 hours x 7 days #28 0RF sent to pharmacy on file. PUS scheduled for 04/18/2016 at 8:30 am with 9 am consult with Dr.Jertson. She is agreeable to date and time. Order for PUS placed for precert.  Cc: Lerry Liner for precert of PUS  Routing to provider for final review. Patient agreeable to disposition. Will close encounter.

## 2016-04-10 NOTE — Telephone Encounter (Signed)
Ana [name] with [facility] sleep center called me back to let me know that her insurance will NOT pay for in lab sleep study but will pay for a home sleep test. i am cancelling test and putting in again home sleep test

## 2016-04-10 NOTE — Addendum Note (Signed)
Addended by: Minette Headland A on: 04/10/2016 09:38 AM   Modules accepted: Orders

## 2016-04-10 NOTE — Telephone Encounter (Signed)
-----   Message from Salvadore Dom, MD sent at 04/10/2016  4:08 PM EDT ----- Please inform the patient that her pap was normal, but showed signs of BV and Actinomyces. If she is symptomatic for the BV treat with flagyl 500 mg po BID x 7 days. The Actinomyces doesn't need to be treated if she isn't symptomatic, but it is possible for actinomyces to cause and infection. Her exam was normal, but she is having pain. She is supposed to return for a TV u/s (I thought I ordered it, but don't see it, so please order it) with a repeat exam. We need to discuss possible IUD removal. Please call in penicillin V, 500 mg po q 6 hours x 1 week and try to schedule for her ultrasound for next week.

## 2016-04-17 ENCOUNTER — Telehealth: Payer: Self-pay | Admitting: Obstetrics and Gynecology

## 2016-04-17 MED ORDER — FLUCONAZOLE 150 MG PO TABS: 150.0000 mg | ORAL_TABLET | Freq: Once | ORAL | Status: DC

## 2016-04-17 NOTE — Telephone Encounter (Signed)
Called patient to review benefits for a recommended procedure. Left Voicemail requesting a call back. °

## 2016-04-17 NOTE — Telephone Encounter (Signed)
Patient was seen on 04/01/2016 with Dr.Jertson. Is being treated for BV and Actinomyces on her pap with Flagyl 500 mg bid x 7 days and Penicillin 500 mg q 6 hours x 7 days. Reports yesterday she developed vaginal itching with thick white discharge. States the symptoms have worsened over night. She is scheduled to be seen in the office tomorrow 04/18/2016 for a PUS. Patient is concerned about having PUS with a yeast infection. Advised I will speak with Dr.Jertson regarding symptoms and PUS scheduling and return call with further recommendations. She is agreeable.

## 2016-04-17 NOTE — Telephone Encounter (Signed)
Please treat her with diflucan 150 mg po x 1 now. Can repeat x 1 in 72 hours if needed. #2, no refills.

## 2016-04-17 NOTE — Telephone Encounter (Signed)
Patient returned call. Spoke with pt regarding benefit for ultrasound. Patient understood and agreeable. Patient ready to schedule. Patient scheduled 04/18/16 with Dr Talbert Nan. Pt aware of arrival date and time. Pt aware of 72 hours cancellation policy with [id number] fee. No further questions. Ok to close

## 2016-04-17 NOTE — Telephone Encounter (Signed)
Patient states she is having a reaction from the pencillin she is taking. She states she has a yeast infection and is scheduled for an ultrasound tomorrow.

## 2016-04-17 NOTE — Telephone Encounter (Signed)
Spoke with patient. Advised of message as seen below from [place]. She is agreeable and verbalizes understanding. Rx for Diflucan 150 mg po x 1 now, repeat in 72 hours if symptoms persist #2 0RF sent to pharmacy on file. Advised it is okay to proceed with PUS as scheduled for tomorrow. She is agreeable.  Routing to provider for final review. Patient agreeable to disposition. Will close encounter.

## 2016-04-18 ENCOUNTER — Encounter: Payer: Self-pay | Admitting: Obstetrics and Gynecology

## 2016-04-18 ENCOUNTER — Ambulatory Visit (INDEPENDENT_AMBULATORY_CARE_PROVIDER_SITE_OTHER): Payer: BLUE CROSS/BLUE SHIELD

## 2016-04-18 ENCOUNTER — Ambulatory Visit (INDEPENDENT_AMBULATORY_CARE_PROVIDER_SITE_OTHER): Payer: BLUE CROSS/BLUE SHIELD | Admitting: Obstetrics and Gynecology

## 2016-04-18 VITALS — BP 108/78 | HR 80 | Resp 16 | Wt 226.0 lb

## 2016-04-18 DIAGNOSIS — R1030 Lower abdominal pain, unspecified: Secondary | ICD-10-CM | POA: Diagnosis not present

## 2016-04-18 DIAGNOSIS — R102 Pelvic and perineal pain: Secondary | ICD-10-CM

## 2016-04-18 DIAGNOSIS — Z30431 Encounter for routine checking of intrauterine contraceptive device: Secondary | ICD-10-CM

## 2016-04-18 DIAGNOSIS — R197 Diarrhea, unspecified: Secondary | ICD-10-CM | POA: Diagnosis not present

## 2016-04-18 NOTE — Progress Notes (Signed)
Patient ID: Ana [name], female   DOB: 07-28-1986, 30 y.o.   MRN: [id number] GYNECOLOGY  VISIT   HPI: 30 y.o.   Single  Caucasian  female   G0P0 with Patient's last menstrual period was 02/29/2016 (exact date).   here for pelvic U/S. The patient has a h/o abnormal uterine bleeding, she has needed hospitalization, D&C, and transfusions because of the bleeding in the past. She has an IUD and has intermittent vaginal bleeding x 2-3 days. Much lighter. The bleeding is typically associated with sex or exercise. The patient has PTSD from her prior hospitalization, every time she bleeds she has a panic attack. She also has been having intermittent lower abdominal pain. She reports frequent loose stools.   GYNECOLOGIC HISTORY: Patient's last menstrual period was 02/29/2016 (exact date). Contraception:IUD, female partner Menopausal hormone therapy: None        OB History    Gravida Para Term Preterm AB TAB SAB Ectopic Multiple Living   0                  Patient Active Problem List   Diagnosis Date Noted  . PCOS (polycystic ovarian syndrome) 07/06/2012    Past Medical History  Diagnosis Date  . Anemia   . Blood transfusion     May 2012  . Prediabetes   . Blood transfusion without reported diagnosis   . Lipids abnormal     History reviewed. No pertinent past surgical history.  Current Outpatient Prescriptions  Medication Sig Dispense Refill  . Cyanocobalamin (B-12 PO) Take by mouth.    . fluconazole (DIFLUCAN) 150 MG tablet Take 1 tablet (150 mg total) by mouth once. Repeat in 72 hours if symptoms persist. 2 tablet 0  . levonorgestrel (MIRENA) 20 MCG/24HR IUD 1 each by Intrauterine route once.    Marland Kitchen MAGNESIUM ASPARTATE PO Take by mouth.    . metroNIDAZOLE (FLAGYL) 500 MG tablet Take 1 tablet (500 mg total) by mouth 2 (two) times daily. 14 tablet 0  . Multiple Vitamins-Minerals (MULTIVITAMIN GUMMIES ADULT) CHEW Chew 1 tablet by mouth daily.    . Omega-3 Fatty Acids (FISH OIL PO) Take  by mouth.    . penicillin v potassium (VEETID) 500 MG tablet Take 1 tablet (500 mg total) by mouth 4 (four) times daily. 28 tablet 0   No current facility-administered medications for this visit.     ALLERGIES: Review of patient's allergies indicates no known allergies.  Family History  Problem Relation Age of Onset  . Hypertension Paternal Aunt   . Heart Problems Maternal Grandmother   . Hashimoto's thyroiditis Mother   . Depression Mother   . Alcohol abuse Mother   . Sleep apnea Father   . Diabetes Maternal Grandfather     Social History   Social History  . Marital Status: Single    Spouse Name: N/A  . Number of Children: N/A  . Years of Education: N/A   Occupational History  . Not on file.   Social History Main Topics  . Smoking status: Current Every Day Smoker -- 0.10 packs/day for 3 years    Types: Cigarettes  . Smokeless tobacco: Former Systems developer    Quit date: 03/11/2016  . Alcohol Use: No  . Drug Use: No  . Sexual Activity: Yes    Birth Control/ Protection: IUD   Other Topics Concern  . Not on file   Social History Narrative    Review of Systems  Constitutional: Negative.   HENT: Negative.  Eyes: Negative.   Respiratory: Negative.   Cardiovascular: Negative.   Gastrointestinal: Negative.   Genitourinary: Negative.   Musculoskeletal: Negative.   Skin: Negative.   Neurological: Negative.   Endo/Heme/Allergies: Negative.   Psychiatric/Behavioral: Negative.     PHYSICAL EXAMINATION:    BP 108/78 mmHg  Pulse 80  Resp 16  Wt 226 lb (102.513 kg)  LMP 02/29/2016 (Exact Date)    General appearance: alert, cooperative and appears stated age   Ultrasound: see separate full report. I was present for a portion of the scan, initial pictures with question of an endometrial polyp. Not seen on live scan, no blood flow, not on 3D scan. Uterus: 7.75 x 5.25 x 3.41 cm Endometrium: 7.81 cm, IUD in place Left ovary 4.94 x 3.27 x 2.25 cm Right ovary 5.21 x 2.79  cm   ASSESSMENT Abdominal pain, IUD in place normal ultrasound. The patient does report frequent loose stools, the abdominal pain could be from a GI source PTSD, stress with bleeding   PLAN Will refer to Dr Collene Mares for GI evaluation She will continue with the IUD for now   An After Visit Summary was printed and given to the patient.

## 2016-04-22 ENCOUNTER — Ambulatory Visit (HOSPITAL_BASED_OUTPATIENT_CLINIC_OR_DEPARTMENT_OTHER): Payer: BLUE CROSS/BLUE SHIELD | Attending: Family Medicine | Admitting: Internal Medicine

## 2016-04-22 VITALS — Ht 66.0 in | Wt 230.0 lb

## 2016-04-22 DIAGNOSIS — G479 Sleep disorder, unspecified: Secondary | ICD-10-CM

## 2016-04-22 DIAGNOSIS — R0681 Apnea, not elsewhere classified: Secondary | ICD-10-CM

## 2016-04-22 DIAGNOSIS — R0902 Hypoxemia: Secondary | ICD-10-CM | POA: Diagnosis not present

## 2016-04-22 DIAGNOSIS — R0683 Snoring: Secondary | ICD-10-CM

## 2016-04-22 DIAGNOSIS — R5383 Other fatigue: Secondary | ICD-10-CM

## 2016-04-22 DIAGNOSIS — G4733 Obstructive sleep apnea (adult) (pediatric): Secondary | ICD-10-CM | POA: Diagnosis not present

## 2016-04-27 DIAGNOSIS — R0683 Snoring: Secondary | ICD-10-CM | POA: Diagnosis not present

## 2016-04-27 DIAGNOSIS — R0681 Apnea, not elsewhere classified: Secondary | ICD-10-CM

## 2016-04-27 DIAGNOSIS — G479 Sleep disorder, unspecified: Secondary | ICD-10-CM

## 2016-04-27 DIAGNOSIS — R5383 Other fatigue: Secondary | ICD-10-CM

## 2016-04-27 NOTE — Procedures (Signed)
    Patient Name: Ana [name], Ana [name] Date: 04/22/2016 Gender: Female D.O.B: 06-13-1986 Age (years): 29 Referring Provider: Girtha Rm NP Height (inches): 28 Interpreting Physician: Baird Lyons MD, ABSM Weight (lbs): 230 RPSGT: Jacolyn Reedy BMI: 37 MRN: [id number] Neck Size: 14.50 CLINICAL INFORMATION Sleep Study Type:UNATTENDED HST   Indication for sleep study: Snoring   Epworth Sleepiness Score: 12 SLEEP STUDY TECHNIQUE A multi-channel overnight portable sleep study was performed. The channels recorded were: nasal airflow, thoracic respiratory movement, and oxygen saturation with a pulse oximetry. Snoring was also monitored.  MEDICATIONS Patient self administered medications include: none reported during this study.  SLEEP ARCHITECTURE Patient was studied for 564.5 minutes. The sleep efficiency was 98.3 % and the patient was supine for 99.8%. The arousal index was 0.0 per hour.  RESPIRATORY PARAMETERS The overall AHI was 61.9 per hour, with a central apnea index of 0.0 per hour. The oxygen nadir was 75% during sleep.  CARDIAC DATA Mean heart rate during sleep was 68.6 bpm.  IMPRESSIONS - Severe obstructive sleep apnea occurred during this study (AHI = 61.9/h). - No significant central sleep apnea occurred during this study (CAI = 0.0/h). - Severe oxygen desaturation was noted during this study (Min O2 = 75%). - Patient snored   DIAGNOSIS - Obstructive Sleep Apnea (327.23 [G47.33 ICD-10]) - Nocturnal Hypoxemia (327.26 [G47.36 ICD-10])  RECOMMENDATIONS - Discuss treatment options with provider. CPAP would be the usual choice with scores in this range. - Positional therapy avoiding supine position during sleep. - Avoid alcohol, sedatives and other CNS depressants that may worsen sleep apnea and disrupt normal sleep architecture. - Sleep hygiene should be reviewed to assess factors that may improve sleep quality. - Weight management and regular  exercise should be initiated or continued.  Deneise Lever Diplomate, American Board of Sleep Medicine  ELECTRONICALLY SIGNED ON:  04/27/2016, 4:42 PM [place] PH: [phone]   FX: [phone] [place]

## 2016-05-21 ENCOUNTER — Ambulatory Visit (INDEPENDENT_AMBULATORY_CARE_PROVIDER_SITE_OTHER): Payer: BLUE CROSS/BLUE SHIELD | Admitting: Medical

## 2016-05-21 ENCOUNTER — Encounter: Payer: Self-pay | Admitting: Medical

## 2016-05-21 VITALS — BP 128/80 | HR 97 | Wt 219.0 lb

## 2016-05-21 DIAGNOSIS — R51 Headache: Secondary | ICD-10-CM | POA: Diagnosis not present

## 2016-05-21 DIAGNOSIS — R0683 Snoring: Secondary | ICD-10-CM

## 2016-05-21 DIAGNOSIS — G4733 Obstructive sleep apnea (adult) (pediatric): Secondary | ICD-10-CM

## 2016-05-21 DIAGNOSIS — R7989 Other specified abnormal findings of blood chemistry: Secondary | ICD-10-CM

## 2016-05-21 DIAGNOSIS — R0681 Apnea, not elsewhere classified: Secondary | ICD-10-CM

## 2016-05-21 DIAGNOSIS — R5382 Chronic fatigue, unspecified: Secondary | ICD-10-CM | POA: Diagnosis not present

## 2016-05-21 DIAGNOSIS — R945 Abnormal results of liver function studies: Secondary | ICD-10-CM

## 2016-05-21 DIAGNOSIS — R519 Headache, unspecified: Secondary | ICD-10-CM

## 2016-05-21 NOTE — Progress Notes (Signed)
Subjective: Chief Complaint  Patient presents with  . Advice Only    regarding sleep study. no problems or concerns   Here to discuss sleep study results.   Prior to sleep study was having fatigue, feeling tired all the time, has been advised she snores loudly, and mom notes on multiple occasions witnessed apnea.   Her parents both have CPAP.    Saw Dr. Collene Mares recently for elevated liver tests.     Past Medical History  Diagnosis Date  . Anemia   . Blood transfusion     May 2012  . Prediabetes   . Blood transfusion without reported diagnosis   . Lipids abnormal    ROS as in subjective   Objective: BP 128/80 mmHg  Pulse 97  Wt 219 lb (99.338 kg)  Wt Readings from Last 3 Encounters:  05/21/16 219 lb (99.338 kg)  04/22/16 230 lb (104.327 kg)  04/18/16 226 lb (102.513 kg)   BP Readings from Last 3 Encounters:  05/21/16 128/80  04/18/16 108/78  04/01/16 112/60    General appearance: alert, no distress, WD/WN, obese white female HEENT: normocephalic, sclerae anicteric, TMs pearly, nares patent, no discharge or erythema, pharynx normal Oral cavity: MMM, no lesions Neck: supple, no lymphadenopathy, no thyromegaly, no masses Heart: RRR, normal S1, S2, no murmurs Lungs: CTA bilaterally, no wheezes, rhonchi, or rales Ext: no edema Pulses: 2+ symmetric, upper and lower extremities, normal cap refill   Assessment: Encounter Diagnoses  Name Primary?  . OSA (obstructive sleep apnea) Yes  . Witnessed apneic spells   . Snoring   . Chronic fatigue   . Frequent headaches   . Elevated LFTs      Plan: Reviewed sleep study showing severe OSA with desaturation to 75%, >60 AHI.   Advised weight loss, diet and exercise changes, begin CPAP, referral for CPAP made, elevated head of bed.   Discussed goals of therapy.  Plan for 70mo f/u with compliance report and update on symptoms in 46mo.   This can be in person, mychart or by phone.    Will request recent consult notes from Dr. Collene Mares  regarding elevated LFTs, likely due to fatty liver disease.  She voiced understanding and agreement of the plan.

## 2016-06-12 ENCOUNTER — Telehealth: Payer: Self-pay

## 2016-06-12 NOTE — Telephone Encounter (Signed)
Records rcvd from Dr. Lorie Apley office- placed in your folder for review. Ana [name]

## 2016-06-27 ENCOUNTER — Telehealth: Payer: Self-pay

## 2016-06-27 NOTE — Telephone Encounter (Signed)
[employer]

## 2016-06-27 NOTE — Telephone Encounter (Signed)
Pt is a Ana [name] pt and would like to know if any chiropractors could be recommended to her?  Pt also wanted to say that since starting the CPAP machine she can feel a night and day difference and is so grateful to [facility] for helping her for this.

## 2016-06-27 NOTE — Telephone Encounter (Signed)
Dr.Lalonde please advise  

## 2016-06-27 NOTE — Telephone Encounter (Signed)
Called and gave pt # [facility]

## 2016-07-29 ENCOUNTER — Encounter: Payer: Self-pay | Admitting: Family Medicine

## 2016-08-10 ENCOUNTER — Encounter: Payer: Self-pay | Admitting: Family Medicine

## 2016-08-29 ENCOUNTER — Encounter: Payer: Self-pay | Admitting: Family Medicine

## 2016-08-29 ENCOUNTER — Other Ambulatory Visit: Payer: Self-pay | Admitting: Family Medicine

## 2016-08-29 ENCOUNTER — Ambulatory Visit (INDEPENDENT_AMBULATORY_CARE_PROVIDER_SITE_OTHER): Payer: BLUE CROSS/BLUE SHIELD | Admitting: Family Medicine

## 2016-08-29 VITALS — BP 116/80 | HR 98 | Wt 220.0 lb

## 2016-08-29 DIAGNOSIS — R41840 Attention and concentration deficit: Secondary | ICD-10-CM

## 2016-08-29 DIAGNOSIS — F411 Generalized anxiety disorder: Secondary | ICD-10-CM | POA: Diagnosis not present

## 2016-08-29 DIAGNOSIS — Z23 Encounter for immunization: Secondary | ICD-10-CM | POA: Diagnosis not present

## 2016-08-29 LAB — CBC WITH DIFFERENTIAL/PLATELET
Basophils Absolute: 0 cells/uL (ref 0–200)
Basophils Relative: 0 %
Eosinophils Absolute: 148 cells/uL (ref 15–500)
Eosinophils Relative: 2 %
HCT: 44.6 % (ref 35.0–45.0)
Hemoglobin: 15.2 g/dL (ref 11.7–15.5)
LYMPHS PCT: 31 %
Lymphs Abs: 2294 cells/uL (ref 850–3900)
MCH: 28.8 pg (ref 27.0–33.0)
MCHC: 34.1 g/dL (ref 32.0–36.0)
MCV: 84.5 fL (ref 80.0–100.0)
MONO ABS: 296 {cells}/uL (ref 200–950)
MONOS PCT: 4 %
MPV: 9.9 fL (ref 7.5–12.5)
Neutro Abs: 4662 cells/uL (ref 1500–7800)
Neutrophils Relative %: 63 %
Platelets: 235 10*3/uL (ref 140–400)
RBC: 5.28 MIL/uL — AB (ref 3.80–5.10)
RDW: 13.4 % (ref 11.0–15.0)
WBC: 7.4 10*3/uL (ref 4.0–10.5)

## 2016-08-29 LAB — BASIC METABOLIC PANEL
BUN: 12 mg/dL (ref 7–25)
CHLORIDE: 107 mmol/L (ref 98–110)
CO2: 23 mmol/L (ref 20–31)
Calcium: 9.6 mg/dL (ref 8.6–10.2)
Creat: 0.88 mg/dL (ref 0.50–1.10)
Glucose, Bld: 110 mg/dL — ABNORMAL HIGH (ref 65–99)
POTASSIUM: 4.3 mmol/L (ref 3.5–5.3)
Sodium: 141 mmol/L (ref 135–146)

## 2016-08-29 LAB — TSH: TSH: 1.56 mIU/L

## 2016-08-29 NOTE — Progress Notes (Signed)
Subjective:    Patient ID: Ana [name], female    DOB: 03/10/1986, 30 y.o.   MRN: [id number]  HPI Chief Complaint  Patient presents with  . ADHD    ADHD and Anxiety   She is here with complaints of difficulty focusing in school. 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 States she daydreams in class, has difficulty starting tasks and completing them. She describes feeling like she has extra energy and it is "frazzled energy". 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   She reports feeling increasing anxious and thinks it is related to her difficulty concentrating and the stress of consequences when she cannot get her work done. Denies history of anxiety. States she has always been a pretty even keel person. State her partner pointed out that she has been distracted and anxious.  States this is causing stress in her relationship.  She denies having issues as a child or in high school with attention and hyperactivity. States she believes she first noticed feeling this way approximately 5 years ago. Has never taken medication.   She recently went to [facility] on campus counseling center and requested ADHD testing. They told her she is on a long list and cannot schedule her for this for 5-6 weeks. She is requesting that I send her somewhere sooner to have testing done.  States she has also been smoking cigarettes more over the past few weeks due to stress and anxiety. Does not drink alcohol or use drugs.  Denies history of addiction but reports mother has alcohol addiction.  .  Denies fever, chills, headache, chest pain, shortness of breath, abdominal pain, nausea, vomiting, diarrhea.    Is using CPAP and sleeping well. Denies issues with insomnia.       GAD 7 : Generalized Anxiety Score 08/29/2016  Nervous, Anxious, on Edge 2  Control/stop worrying 2  Worry too much - different  things 3  Trouble relaxing 3  Restless 3  Easily annoyed or irritable 1  Afraid - awful might happen 1  Total GAD 7 Score 15  Anxiety Difficulty Somewhat difficult      Review of Systems Pertinent positives and negatives in the history of present illness.     Objective:   Physical Exam BP 116/80   Pulse 98   Wt 220 lb (99.8 kg)   BMI 35.51 kg/m  Alert and in no distress.  Cardiac exam shows a regular sinus rhythm without murmurs or gallops. Lungs are clear to auscultation.     Assessment & Plan:  Difficulty concentrating - Plan: CBC with Differential/Platelet, Basic metabolic panel, TSH  Generalized anxiety disorder - Plan: CBC with Differential/Platelet, Basic metabolic panel, TSH  Needs flu shot - Plan: Flu Vaccine QUAD 36+ mos IM  She is scheduled to have formal ADHD testing at [facility] in 5 weeks. Plan to refer her to [facility] to get her in sooner for this.  Suspect that she may have ADD causing anxiety issues however cannot rule out other mental illness or types of anxiety.  Advised to try and find time out of her day to do something she enjoys and to try and reduce her stress in healthy ways, deep breathing, walks, massage.  Plan to order labs.  Will await feedback from [facility] and consider medication as recommended.  Follow  up in 1 month or pending labs.

## 2016-08-29 NOTE — Patient Instructions (Signed)
[facility] [address] [place], [place] [address]  Phone: [phone] Fax: [phone]

## 2016-08-30 LAB — HEMOGLOBIN A1C
HEMOGLOBIN A1C: 5.2 % (ref <5.7)
Mean Plasma Glucose: 103 mg/dL

## 2016-09-03 ENCOUNTER — Encounter: Payer: Self-pay | Admitting: Family Medicine

## 2016-12-09 DIAGNOSIS — F909 Attention-deficit hyperactivity disorder, unspecified type: Secondary | ICD-10-CM

## 2016-12-09 HISTORY — DX: Attention-deficit hyperactivity disorder, unspecified type: F90.9

## 2017-01-15 ENCOUNTER — Encounter: Payer: Self-pay | Admitting: Family Medicine

## 2017-01-15 ENCOUNTER — Ambulatory Visit (INDEPENDENT_AMBULATORY_CARE_PROVIDER_SITE_OTHER): Payer: BLUE CROSS/BLUE SHIELD | Admitting: Family Medicine

## 2017-01-15 VITALS — BP 118/80 | HR 83 | Temp 98.0°F | Resp 16 | Wt 219.0 lb

## 2017-01-15 DIAGNOSIS — R05 Cough: Secondary | ICD-10-CM | POA: Diagnosis not present

## 2017-01-15 DIAGNOSIS — H6691 Otitis media, unspecified, right ear: Secondary | ICD-10-CM | POA: Diagnosis not present

## 2017-01-15 DIAGNOSIS — R059 Cough, unspecified: Secondary | ICD-10-CM

## 2017-01-15 MED ORDER — AMOXICILLIN 875 MG PO TABS: 875.0000 mg | ORAL_TABLET | Freq: Two times a day (BID) | ORAL | 0 refills | Status: DC

## 2017-01-15 MED ORDER — FLUCONAZOLE 150 MG PO TABS: 150.0000 mg | ORAL_TABLET | Freq: Once | ORAL | 0 refills | Status: AC

## 2017-01-15 NOTE — Progress Notes (Signed)
Subjective: Chief Complaint  Patient presents with  . ear infection    ear infection- start a couple days ago, cough up mucous.      Ana [name] is a 31 y.o. female who presents for right ear pain and sounds being muffled for the past 2 days. States pain is getting worse. States last week she had URI symptoms including nasal congestion, rhinorrhea, and cough. States she continues to have cough, occasionally productive of greenish sputum. States she has to wear a headset at work on her right ear and having issues hearing.   Denies fever, chills, headache, body aches, sore throat, chest pain, palpitations, shortness of breath, wheezing, GI or GU symptoms.   She is a smoker. History of pneumonia in distant past.   Treatment to date: cough suppressants and decongestants.  Positive sick contacts.  No other aggravating or relieving factors.  No other c/o.  ROS as in subjective.   Objective: Vitals:   01/15/17 1017  BP: 118/80  Pulse: 83  Resp: 16  Temp: 98 F (36.7 C)    General appearance: Alert, WD/WN, no distress, mildly ill appearing                             Skin: warm, no rash                           Head: no sinus tenderness                            Eyes: conjunctiva normal, corneas clear, PERRLA                            Ears: right TM dull and grey, left TM normal, external ear canals normal                          Nose: septum midline, turbinates swollen, with erythema and clear discharge             Mouth/throat: MMM, tongue normal, mild pharyngeal erythema                           Neck: supple, no adenopathy, no thyromegaly, nontender                          Heart: RRR, normal S1, S2, no murmurs                         Lungs: mild expiratory wheezes left lower lobe otherwise clear, rales, or rhonchi      Assessment: Acute otitis media, right  Cough   Plan: Discussed diagnosis and treatment of acute otitis media and cough.  Amoxicillin prescribed.  Diflucan also prescribed per patient request due to history of yeast infection after Abx use. Suggested symptomatic OTC remedies.Nasal saline spray for congestion.  Tylenol or Ibuprofen OTC for fever and malaise.  Call/return if not back to 100% after finishing antibiotic.

## 2017-01-15 NOTE — Patient Instructions (Signed)
Take the antibiotic until finished and if you are not back to 100% let me know. Increase water intake.   Otitis Media, Adult Otitis media is redness, soreness, and inflammation of the middle ear. Otitis media may be caused by allergies or, most commonly, by infection. Often it occurs as a complication of the common cold. What are the signs or symptoms? Symptoms of otitis media may include:  Earache.  Fever.  Ringing in your ear.  Headache.  Leakage of fluid from the ear. How is this diagnosed? To diagnose otitis media, your health care provider will examine your ear with an otoscope. This is an instrument that allows your health care provider to see into your ear in order to examine your eardrum. Your health care provider also will ask you questions about your symptoms. How is this treated? Typically, otitis media resolves on its own within 3-5 days. Your health care provider may prescribe medicine to ease your symptoms of pain. If otitis media does not resolve within 5 days or is recurrent, your health care provider may prescribe antibiotic medicines if he or she suspects that a bacterial infection is the cause. Follow these instructions at home:  If you were prescribed an antibiotic medicine, finish it all even if you start to feel better.  Take medicines only as directed by your health care provider.  Keep all follow-up visits as directed by your health care provider. Contact a health care provider if:  You have otitis media only in one ear, or bleeding from your nose, or both.  You notice a lump on your neck.  You are not getting better in 3-5 days.  You feel worse instead of better. Get help right away if:  You have pain that is not controlled with medicine.  You have swelling, redness, or pain around your ear or stiffness in your neck.  You notice that part of your face is paralyzed.  You notice that the bone behind your ear (mastoid) is tender when you touch  it. This information is not intended to replace advice given to you by your health care provider. Make sure you discuss any questions you have with your health care provider. Document Released: 08/30/2004 Document Revised: 05/02/2016 Document Reviewed: 06/22/2013 Elsevier Interactive Patient Education  2017 [employer].

## 2017-01-20 ENCOUNTER — Telehealth: Payer: Self-pay | Admitting: Family Medicine

## 2017-01-20 NOTE — Telephone Encounter (Signed)
I recommend we give this more time and have her call when she finishes the antibiotic.

## 2017-01-20 NOTE — Telephone Encounter (Signed)
Pt still has few more days of antibiotics, she is having little to no drainage or congestion. She can't seems to get her ears to pop and having a lot of pressure and seems to be going into the other ear. She has been using a nasal spray and claritin D. She will try sudafed later one to see if that helps. Please advise if you send something else in to pthe pharmacy

## 2017-01-20 NOTE — Telephone Encounter (Signed)
Please call and find out if she has completed the antibiotic. How much better is she? Still having nasal congestion or drainage? Has she tried sudafed? Nasal spray?

## 2017-01-20 NOTE — Telephone Encounter (Signed)
Pt called and stated that she is still having a lot of pressure in her right ear. Pt can be reached at [phone] and uses [facility].

## 2017-01-20 NOTE — Telephone Encounter (Signed)
Read message below

## 2017-01-23 ENCOUNTER — Telehealth: Payer: Self-pay | Admitting: Family Medicine

## 2017-01-23 NOTE — Telephone Encounter (Signed)
Pt called back and stated that she finished medication tomorrow and she is still having issues.

## 2017-01-26 NOTE — Telephone Encounter (Signed)
How much better is she percentage wise? She may need to have her ear checked again if not back to normal. Is she having any new symptoms?

## 2017-01-27 ENCOUNTER — Encounter: Payer: Self-pay | Admitting: Family Medicine

## 2017-01-27 ENCOUNTER — Ambulatory Visit (INDEPENDENT_AMBULATORY_CARE_PROVIDER_SITE_OTHER): Payer: BLUE CROSS/BLUE SHIELD | Admitting: Family Medicine

## 2017-01-27 VITALS — BP 118/64 | HR 82 | Temp 98.0°F

## 2017-01-27 DIAGNOSIS — H938X1 Other specified disorders of right ear: Secondary | ICD-10-CM

## 2017-01-27 DIAGNOSIS — H9191 Unspecified hearing loss, right ear: Secondary | ICD-10-CM

## 2017-01-27 NOTE — Telephone Encounter (Signed)
Pt being seen today

## 2017-01-27 NOTE — Progress Notes (Signed)
   Subjective:    Patient ID: Ana [name], female    DOB: [birth year], 31 y.o.   MRN: [id number]  HPI Chief Complaint  Patient presents with  . follow-up    ear pressure, no pain,    She is here with complaints of a 2 week history of right ear pressure and decreased hearing.  She was diagnosed with a right ear infection and acute URI on 01/15/2017. She completed a round of Amoxicillin and states all of her symptoms cleared up except for right ear pressure and sounds being muffled to her right ear. States this is affecting her job.  States she has been taking Zyrtec D without any relief. History of multiple ear infections as a child.   Denies fever, chills, headache, nasal congestion, rhinorrhea, ear pain, sore throat, cough, N/V/D.    Review of Systems Pertinent positives and negatives in the history of present illness.     Objective:   Physical Exam  Constitutional: She appears well-developed and well-nourished. No distress.  HENT:  Right Ear: Ear canal normal. No decreased hearing is noted.  Left Ear: Hearing, tympanic membrane and ear canal normal.  Nose: Nose normal. Right sinus exhibits no maxillary sinus tenderness and no frontal sinus tenderness. Left sinus exhibits no maxillary sinus tenderness and no frontal sinus tenderness.  Mouth/Throat: Uvula is midline, oropharynx is clear and moist and mucous membranes are normal.  Dull right TM and fluid behind right TM without erythema, bulging or retraction.    BP 118/64   Pulse 82   Temp 98 F (36.7 C) (Oral)       Assessment & Plan:  Decreased hearing, right  Ear pressure, right  Discussed that she no longer has an infection but fluid is present. Will have her continue with decongestant but she can stop if not noticing any improvement.  She may try Sudafed since she has not tried this.  Referral to ENT.

## 2017-01-27 NOTE — Patient Instructions (Signed)
The ENT will call you.

## 2017-02-11 ENCOUNTER — Telehealth: Payer: Self-pay | Admitting: Internal Medicine

## 2017-02-11 NOTE — Telephone Encounter (Signed)
Pt is scheduled with [facility]NT on 02/28/17 @ 2:40pm with Dr. Constance Holster.   Phone # [phone]  Address: [address]       [place]    [place], [place] [address]

## 2017-03-24 ENCOUNTER — Encounter: Payer: Self-pay | Admitting: Family Medicine

## 2017-03-24 ENCOUNTER — Ambulatory Visit (INDEPENDENT_AMBULATORY_CARE_PROVIDER_SITE_OTHER): Payer: BLUE CROSS/BLUE SHIELD | Admitting: Family Medicine

## 2017-03-24 VITALS — BP 114/74 | HR 72 | Temp 98.3°F | Wt 208.4 lb

## 2017-03-24 DIAGNOSIS — R21 Rash and other nonspecific skin eruption: Secondary | ICD-10-CM | POA: Diagnosis not present

## 2017-03-24 MED ORDER — TRIAMCINOLONE ACETONIDE 0.1 % EX CREA: 1.0000 "application " | TOPICAL_CREAM | Freq: Two times a day (BID) | CUTANEOUS | 0 refills | Status: DC

## 2017-03-24 NOTE — Progress Notes (Signed)
   Subjective:    Patient ID: Ana [name], female    DOB: 22-Oct-1986, 31 y.o.   MRN: [id number]  HPI Chief Complaint  Patient presents with  . rash    rash on chest on and back and under belt line. not sure if she is having allergic reaction to vyvanse or another med.    She is here with complaints of a pruritic and burning rash that initially started under her breasts 2 weeks ago and has gradually worsened to her bilateral breasts, chest, shoulders, and lower back. States she feels like the rash is getting worse.   Denies fever, chills, headache, dizziness, neck pain, sore throat, cough, abdominal pain, N/V/D. No rash on palms or soles. She questions whether this could be a medication reaction.   States she switched medication from vyvanse to [facility] for ADHD.  States she stopped it last Tuesday to see if the rash would get better. States it has not.  Denies any new lotions, soaps, detergents or clothing.   States she has tried hydrocortisone and benadryl.    Review of Systems Pertinent positives and negatives in the history of present illness.     Objective:   Physical Exam  Constitutional: She is oriented to person, place, and time. She appears well-developed and well-nourished. No distress.  HENT:  Mouth/Throat: Oropharynx is clear and moist and mucous membranes are normal.  Eyes: Conjunctivae and lids are normal.  Lymphadenopathy:    She has no cervical adenopathy.  Neurological: She is alert and oriented to person, place, and time. She has normal strength. Coordination and gait normal.  Skin: Skin is warm and dry. Rash noted. No pallor.     Flat pinkish-reddish scattered maculopapular rash to upper abdomen, breasts, chest, shoulders, and mid to low back. No surrounding erythema, induration or sign of infection.    BP 114/74   Pulse 72   Temp 98.3 F (36.8 C) (Oral)   Wt 208 lb 6.4 oz (94.5 kg)   BMI 33.64 kg/m       Assessment & Plan:  Rash and nonspecific skin  eruption  Discussed that her rash appears to be nonspecific and no sign of secondary bacterial infection. Difficult to say if medication is the cause but unlikely. Will have her try topical steroid and benadryl and follow up if not improving or worsens.

## 2017-03-24 NOTE — Patient Instructions (Signed)
Use the topical steroid cream and benadryl. Call me if you are getting worse.

## 2017-06-19 ENCOUNTER — Encounter: Payer: Self-pay | Admitting: Obstetrics and Gynecology

## 2017-06-19 ENCOUNTER — Ambulatory Visit (INDEPENDENT_AMBULATORY_CARE_PROVIDER_SITE_OTHER): Payer: BLUE CROSS/BLUE SHIELD | Admitting: Obstetrics and Gynecology

## 2017-06-19 VITALS — BP 102/78 | HR 94 | Resp 16 | Ht 65.0 in | Wt 205.0 lb

## 2017-06-19 DIAGNOSIS — Z30431 Encounter for routine checking of intrauterine contraceptive device: Secondary | ICD-10-CM

## 2017-06-19 DIAGNOSIS — Z3009 Encounter for other general counseling and advice on contraception: Secondary | ICD-10-CM

## 2017-06-19 DIAGNOSIS — Z01419 Encounter for gynecological examination (general) (routine) without abnormal findings: Secondary | ICD-10-CM

## 2017-06-19 DIAGNOSIS — Z113 Encounter for screening for infections with a predominantly sexual mode of transmission: Secondary | ICD-10-CM

## 2017-06-19 MED ORDER — MISOPROSTOL 200 MCG PO TABS: ORAL_TABLET | ORAL | 0 refills | Status: DC

## 2017-06-19 NOTE — Patient Instructions (Signed)

## 2017-06-19 NOTE — Progress Notes (Signed)
31 y.o. G0P0 SingleCaucasianF here for annual exam.  She has a new partner for the last 2-3 months. She desires STD testing. No pain with sex.  H/O PCOS, has a mirena IUD, placed in 8/13. She wants it for contraception and cycle control. She got diagnosed with ADHD a few months ago. She started on Vyvanse and started having light monthly cycles again. The PMS is bad for a week.   Her gender Rojelio Brenner is female. She has a h/o heavy bleeding prior to the IUD, needed a blood transfusion and D&C in 2012-13. PTSD in relation to that bleeding.   Patient's last menstrual period was 05/23/2017.          Sexually active: Yes.   currently female partner The current method of family planning is mirena IUD placed 07/2012.  Exercising: Yes.    walking, lifting Smoker:  Yes, 1/2 a pack a day.   Health Maintenance: Pap:  04/01/16 Neg   07/06/12 Neg  History of abnormal Pap:  no MMG:  never TDaP:  06/2014    reports that she has been smoking Cigarettes.  She has a 1.50 pack-year smoking history. She has never used smokeless tobacco. She reports that she does not drink alcohol or use drugs.1-2 drinks a week. 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   Past Medical History:  Diagnosis Date  . ADHD 2018   Rachel Moulds   . Anemia   . Blood transfusion    May 2012  . Blood transfusion without reported diagnosis   . Lipids abnormal   . Pneumonia   . Prediabetes     History reviewed. No pertinent surgical history.  Current Outpatient Prescriptions  Medication Sig Dispense Refill  . levonorgestrel (MIRENA) 20 MCG/24HR IUD 1 each by Intrauterine route once.    Marland Kitchen VYVANSE 20 MG capsule      No current facility-administered medications for this visit.     Family History  Problem Relation Age of Onset  . Hashimoto's thyroiditis Mother   . Depression Mother   . Alcohol abuse Mother   . Sleep apnea Father   . Hypertension Paternal Aunt   . Heart  Problems Maternal Grandmother   . Diabetes Maternal Grandfather     Review of Systems  Constitutional: Negative.   HENT: Negative.   Eyes: Negative.   Respiratory: Negative.   Cardiovascular: Negative.   Gastrointestinal: Positive for abdominal pain.       Bloating  Endocrine: Negative.   Musculoskeletal: Negative.   Skin: Negative.   Allergic/Immunologic: Negative.   Neurological: Negative.   Hematological: Negative.   Psychiatric/Behavioral: Negative.     Exam:   BP 102/78 (BP Location: Left Arm, Patient Position: Sitting, Cuff Size: Normal)   Pulse 94   Resp 16   Ht 5\' 5"  (1.651 m)   Wt 205 lb (93 kg)   LMP 05/23/2017   BMI 34.11 kg/m   Weight change: @WEIGHTCHANGE @ Height:   Height: 5\' 5"  (165.1 cm)  Ht Readings from Last 3 Encounters:  06/19/17 5\' 5"  (1.651 m)  04/22/16 5\' 6"  (1.676 m)  04/01/16 5\' 6"  (1.676 m)    General appearance: alert, cooperative and appears stated age Head: Normocephalic, without obvious abnormality, atraumatic Neck: no adenopathy, supple, symmetrical, trachea midline and thyroid normal to inspection and palpation Lungs: clear to auscultation bilaterally Cardiovascular: regular rate and rhythm Breasts: normal appearance, no masses or tenderness Abdomen: soft, non-tender; bowel sounds normal;  no masses,  no organomegaly Extremities: extremities normal, atraumatic, no cyanosis or edema Skin: Skin color, texture, turgor normal. No rashes or lesions Lymph nodes: Cervical, supraclavicular, and axillary nodes normal. No abnormal inguinal nodes palpated Neurologic: Grossly normal   Pelvic: External genitalia:  no lesions              Urethra:  normal appearing urethra with no masses, tenderness or lesions              Bartholins and Skenes: normal                 Vagina: normal appearing vagina with normal color and discharge, no lesions              Cervix: no lesions and IUD string 4 cm               Bimanual Exam:  Uterus:  normal size,  contour, position, consistency, mobility, non-tender and anteverted              Adnexa: no mass, fullness, tenderness               Rectovaginal: Confirms               Anus:  normal sphincter tone, no lesions  Chaperone was present for exam.  A:  Well Woman with normal exam  Mirena IUD,due to be changed next month.   Wants the IUD for contraception  P:   No pap this year  STD testing, cervical cultures only  Due for a new IUD next month  Labs with primary MD  Discussed breast self awareness  Discussed calcium and vit D intake

## 2017-06-27 LAB — GC/CHLAMYDIA PROBE AMP

## 2017-07-03 ENCOUNTER — Ambulatory Visit (INDEPENDENT_AMBULATORY_CARE_PROVIDER_SITE_OTHER): Payer: BLUE CROSS/BLUE SHIELD | Admitting: Obstetrics and Gynecology

## 2017-07-03 ENCOUNTER — Encounter: Payer: Self-pay | Admitting: Obstetrics and Gynecology

## 2017-07-03 VITALS — BP 100/70 | HR 80 | Resp 16 | Ht 65.0 in | Wt 206.0 lb

## 2017-07-03 DIAGNOSIS — Z113 Encounter for screening for infections with a predominantly sexual mode of transmission: Secondary | ICD-10-CM

## 2017-07-03 NOTE — Progress Notes (Addendum)
Review of Systems   Constitutional: Negative.    HENT: Negative.    Eyes: Negative.    Respiratory: Negative.    Cardiovascular: Negative.    Gastrointestinal: Negative.    Genitourinary: Negative.    Musculoskeletal: Negative.    Skin: Negative.    Neurological: Negative.    Endo/Heme/Allergies: Negative.    Psychiatric/Behavioral: Negative.

## 2017-07-03 NOTE — Progress Notes (Signed)
GYNECOLOGY  VISIT   HPI: 31 y.o.   Single  Caucasian  female   G0P0 with No LMP recorded. Patient is not currently having periods (Reason: IUD).   here for repeat gc/chl testing. Recent screening could not be processed. She is without c/o. 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 Waiting for her final grade, then she can apply for jobs.   GYNECOLOGIC HISTORY: No LMP recorded. Patient is not currently having periods (Reason: IUD). Contraception: IUD Menopausal hormone therapy: none        OB History    Gravida Para Term Preterm AB Living   0             SAB TAB Ectopic Multiple Live Births                     Patient Active Problem List   Diagnosis Date Noted  . PCOS (polycystic ovarian syndrome) 07/06/2012    Past Medical History:  Diagnosis Date  . ADHD 2018   Rachel Moulds   . Anemia   . Blood transfusion    May 2012  . Blood transfusion without reported diagnosis   . Lipids abnormal   . Pneumonia   . Prediabetes     No past surgical history on file.  Current Outpatient Prescriptions  Medication Sig Dispense Refill  . levonorgestrel (MIRENA) 20 MCG/24HR IUD 1 each by Intrauterine route once.    Marland Kitchen VYVANSE 20 MG capsule     . misoprostol (CYTOTEC) 200 MCG tablet Place 2 tablets vaginally 6-12 hours prior to IUD insertion (Patient not taking: Reported on 07/03/2017) 2 tablet 0   No current facility-administered medications for this visit.      ALLERGIES: Patient has no known allergies.  Family History  Problem Relation Age of Onset  . Hashimoto's thyroiditis Mother   . Depression Mother   . Alcohol abuse Mother   . Sleep apnea Father   . Hypertension Paternal Aunt   . Heart Problems Maternal Grandmother   . Diabetes Maternal Grandfather     Social History   Social History  . Marital status: Single    Spouse name: N/A  . Number of children: N/A  . Years of education: N/A   Occupational History  .  Not on file.   Social History Main Topics  . Smoking status: Current Every Day Smoker    Packs/day: 0.50    Years: 3.00    Types: Cigarettes  . Smokeless tobacco: Never Used  . Alcohol use No  . Drug use: No  . Sexual activity: Yes    Birth control/ protection: IUD   Other Topics Concern  . Not on file   Social History Narrative  . No narrative on file    Review of Systems  Constitutional: Negative.   HENT: Negative.   Eyes: Negative.   Respiratory: Negative.   Cardiovascular: Negative.   Gastrointestinal: Negative.   Genitourinary: Negative.   Musculoskeletal: Negative.   Skin: Negative.   Neurological: Negative.   Endo/Heme/Allergies: Negative.   Psychiatric/Behavioral: Negative.     PHYSICAL EXAMINATION:    BP 100/70 (BP Location: Right Arm, Patient Position: Sitting, Cuff Size: Large)   Pulse 80   Resp 16   Ht 5\' 5"  (1.651 m)   Wt 206 lb (93.4 kg)   BMI 34.28 kg/m     General appearance: alert, cooperative and appears stated age  Pelvic: External genitalia:  no  lesions              Urethra:  normal appearing urethra with no masses, tenderness or lesions              Bartholins and Skenes: normal                 Vagina: normal appearing vagina with normal color and discharge, no lesions              Cervix: no lesions and IUD string seen                ASSESSMENT STD testing    PLAN Desires genprobe only F/U next week for IUD removal and reinsertion   An After Visit Summary was printed and given to the patient.

## 2017-07-07 LAB — GC/CHLAMYDIA PROBE AMP

## 2017-07-09 ENCOUNTER — Ambulatory Visit (INDEPENDENT_AMBULATORY_CARE_PROVIDER_SITE_OTHER): Payer: BLUE CROSS/BLUE SHIELD | Admitting: Obstetrics and Gynecology

## 2017-07-09 ENCOUNTER — Encounter: Payer: Self-pay | Admitting: Obstetrics and Gynecology

## 2017-07-09 VITALS — BP 90/58 | HR 88 | Resp 16 | Wt 205.0 lb

## 2017-07-09 DIAGNOSIS — Z01812 Encounter for preprocedural laboratory examination: Secondary | ICD-10-CM

## 2017-07-09 DIAGNOSIS — Z30433 Encounter for removal and reinsertion of intrauterine contraceptive device: Secondary | ICD-10-CM | POA: Diagnosis not present

## 2017-07-09 DIAGNOSIS — Z3009 Encounter for other general counseling and advice on contraception: Secondary | ICD-10-CM

## 2017-07-09 DIAGNOSIS — Z113 Encounter for screening for infections with a predominantly sexual mode of transmission: Secondary | ICD-10-CM

## 2017-07-09 LAB — POCT URINE PREGNANCY: PREG TEST UR: NEGATIVE

## 2017-07-09 NOTE — Patient Instructions (Signed)

## 2017-07-09 NOTE — Progress Notes (Signed)
GYNECOLOGY  VISIT   HPI: 31 y.o.   Single  Caucasian  female   G0P0 with No LMP recorded. Patient is not currently having periods (Reason: IUD).   here for  IUD removal and re-insertion   Her Genprobe was contaminated and couldn't be run.  GYNECOLOGIC HISTORY: No LMP recorded. Patient is not currently having periods (Reason: IUD). Contraception:IUD (Mirena) Menopausal hormone therapy: none         OB History    Gravida Para Term Preterm AB Living   0             SAB TAB Ectopic Multiple Live Births                     Patient Active Problem List   Diagnosis Date Noted  . PCOS (polycystic ovarian syndrome) 07/06/2012    Past Medical History:  Diagnosis Date  . ADHD 2018   Rachel Moulds   . Anemia   . Blood transfusion    May 2012  . Blood transfusion without reported diagnosis   . Lipids abnormal   . Pneumonia   . Prediabetes     No past surgical history on file.  Current Outpatient Prescriptions  Medication Sig Dispense Refill  . levonorgestrel (MIRENA) 20 MCG/24HR IUD 1 each by Intrauterine route once.    . misoprostol (CYTOTEC) 200 MCG tablet Place 2 tablets vaginally 6-12 hours prior to IUD insertion (Patient not taking: Reported on 07/03/2017) 2 tablet 0  . VYVANSE 20 MG capsule      No current facility-administered medications for this visit.      ALLERGIES: Patient has no known allergies.  Family History  Problem Relation Age of Onset  . Hashimoto's thyroiditis Mother   . Depression Mother   . Alcohol abuse Mother   . Sleep apnea Father   . Hypertension Paternal Aunt   . Heart Problems Maternal Grandmother   . Diabetes Maternal Grandfather     Social History   Social History  . Marital status: Single    Spouse name: N/A  . Number of children: N/A  . Years of education: N/A   Occupational History  . Not on file.   Social History Main Topics  . Smoking status: Current Every Day Smoker    Packs/day: 0.50    Years: 3.00    Types:  Cigarettes  . Smokeless tobacco: Never Used  . Alcohol use No  . Drug use: No  . Sexual activity: Yes    Birth control/ protection: IUD   Other Topics Concern  . Not on file   Social History Narrative  . No narrative on file    Review of Systems  Constitutional: Negative.   HENT: Negative.   Eyes: Negative.   Respiratory: Negative.   Cardiovascular: Negative.   Gastrointestinal: Negative.   Genitourinary: Negative.   Musculoskeletal: Negative.   Skin: Negative.   Neurological: Negative.   Endo/Heme/Allergies: Negative.   Psychiatric/Behavioral: Negative.     PHYSICAL EXAMINATION:    BP (!) 90/58 (BP Location: Right Arm, Patient Position: Sitting, Cuff Size: Normal)   Pulse 88   Resp 16   Wt 205 lb (93 kg)   BMI 34.11 kg/m     General appearance: alert, cooperative and appears stated age  Pelvic: External genitalia:  no lesions              Urethra:  normal appearing urethra with no masses, tenderness or lesions  Bartholins and Skenes: normal                 Vagina: normal appearing vagina with normal color and discharge, no lesions              Cervix: without lesions  The risks of the mirena IUD were reviewed with the patient, including infection, abnormal bleeding and uterine perfortion. Consent was signed. The cytotec tablets were removed.  A speculum was placed in the vagina, the old mirena IUD was removed. The cervix was cleansed with betadine. A tenaculum was placed on the cervix, the uterus sounded to 7 cm. The cervix was dilated to a 5 hagar dilator  The mirena IUD was inserted without difficulty. The string were cut to 3-4 cm. The tenaculum was removed. Slight oozing from the tenaculum site was stopped with pressure.   The patient tolerated the procedure well.   Chaperone was present for exam.  ASSESSMENT IUD removal and reinsertion    PLAN F/U in one month Will send urine for gc/ch   An After Visit Summary was printed and given to  the patient.

## 2017-07-11 LAB — GC/CHLAMYDIA PROBE AMP
CHLAMYDIA, DNA PROBE: NEGATIVE
NEISSERIA GONORRHOEAE BY PCR: NEGATIVE

## 2017-08-06 ENCOUNTER — Encounter: Payer: Self-pay | Admitting: Obstetrics and Gynecology

## 2017-08-06 ENCOUNTER — Ambulatory Visit (INDEPENDENT_AMBULATORY_CARE_PROVIDER_SITE_OTHER): Payer: BLUE CROSS/BLUE SHIELD | Admitting: Obstetrics and Gynecology

## 2017-08-06 VITALS — BP 100/60 | HR 88 | Resp 16 | Wt 204.0 lb

## 2017-08-06 DIAGNOSIS — Z30431 Encounter for routine checking of intrauterine contraceptive device: Secondary | ICD-10-CM

## 2017-08-06 NOTE — Progress Notes (Signed)
GYNECOLOGY  VISIT   HPI: 31 y.o.   Single  Caucasian  female   G0P0 with No LMP recorded. Patient is not currently having periods (Reason: IUD).  The patient is one month s/p IUD removal and reinsertion (mirena).  here for IUD check. No complaints. She is starting her last year of school. 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   GYNECOLOGIC HISTORY: No LMP recorded. Patient is not currently having periods (Reason: IUD). Contraception:IUD (Mirena) Menopausal hormone therapy: none         OB History    Gravida Para Term Preterm AB Living   0             SAB TAB Ectopic Multiple Live Births                     Patient Active Problem List   Diagnosis Date Noted  . PCOS (polycystic ovarian syndrome) 07/06/2012    Past Medical History:  Diagnosis Date  . ADHD 2018   Rachel Moulds   . Anemia   . Blood transfusion    May 2012  . Blood transfusion without reported diagnosis   . Lipids abnormal   . Pneumonia   . Prediabetes     No past surgical history on file.  Current Outpatient Prescriptions  Medication Sig Dispense Refill  . levonorgestrel (MIRENA) 20 MCG/24HR IUD 1 each by Intrauterine route once.    Marland Kitchen VYVANSE 20 MG capsule      No current facility-administered medications for this visit.      ALLERGIES: Patient has no known allergies.  Family History  Problem Relation Age of Onset  . Hashimoto's thyroiditis Mother   . Depression Mother   . Alcohol abuse Mother   . Sleep apnea Father   . Hypertension Paternal Aunt   . Heart Problems Maternal Grandmother   . Diabetes Maternal Grandfather     Social History   Social History  . Marital status: Single    Spouse name: N/A  . Number of children: N/A  . Years of education: N/A   Occupational History  . Not on file.   Social History Main Topics  . Smoking status: Current Every Day Smoker    Packs/day: 0.50    Years: 3.00    Types:  Cigarettes  . Smokeless tobacco: Never Used  . Alcohol use No  . Drug use: No  . Sexual activity: Yes    Birth control/ protection: IUD   Other Topics Concern  . Not on file   Social History Narrative  . No narrative on file    Review of Systems  Constitutional: Negative.   HENT: Negative.   Eyes: Negative.   Respiratory: Negative.   Cardiovascular: Negative.   Gastrointestinal: Negative.   Genitourinary: Negative.   Musculoskeletal: Negative.   Skin: Negative.   Neurological: Negative.   Endo/Heme/Allergies: Negative.   Psychiatric/Behavioral: Negative.     PHYSICAL EXAMINATION:    BP 100/60 (BP Location: Right Arm, Patient Position: Sitting, Cuff Size: Normal)   Pulse 88   Resp 16   Wt 204 lb (92.5 kg)   BMI 33.95 kg/m     General appearance: alert, cooperative and appears stated age   Pelvic: External genitalia:  no lesions              Urethra:  normal appearing urethra with no masses, tenderness or lesions  Bartholins and Skenes: normal                 Vagina: normal appearing vagina with normal color and discharge, no lesions              Cervix: no lesions and IUD string 3 cm              Bimanual Exam:  Uterus:  normal size, contour, position, consistency, mobility, non-tender              Adnexa: no mass, fullness, tenderness               Chaperone was present for exam.  ASSESSMENT IUD check, doing well    PLAN F/U for annual exam in 7/19 Call with any problems.   An After Visit Summary was printed and given to the patient.

## 2017-08-12 ENCOUNTER — Ambulatory Visit (INDEPENDENT_AMBULATORY_CARE_PROVIDER_SITE_OTHER): Payer: BLUE CROSS/BLUE SHIELD | Admitting: Family Medicine

## 2017-08-12 ENCOUNTER — Encounter: Payer: Self-pay | Admitting: Family Medicine

## 2017-08-12 VITALS — BP 110/70 | HR 98 | Temp 98.2°F | Resp 16 | Wt 207.6 lb

## 2017-08-12 DIAGNOSIS — M79661 Pain in right lower leg: Secondary | ICD-10-CM | POA: Diagnosis not present

## 2017-08-12 NOTE — Progress Notes (Signed)
   Subjective:    Patient ID: Ana [name], female    DOB: 1985/12/20, 31 y.o.   MRN: [id number]  HPI Chief Complaint  Patient presents with  . 2 day cramp in leg    2 day cramping in leg into the heel area then goes up into the behind area.    She is here with complaints of a 2 day history of feeling like a cramp in her right upper calf. States the pain radiated down into her lower leg initally. States yesterday the pain radiated up her right lateral leg. States pain is sharp today and pain is worse with weight bearing. Denies redness, warmth or swelling. No tenderness. Denies numbness, tingling or weakness.  No recent surgery or immobilization.  She does smoke.  Has a mirena IUD.   Denies fever, chills, dizziness, chest pain, palpitations, cough, shortness of breath, abdominal pain, N/V/D.    She is concerned that she may have a DVT. States her father had a DVT 2 years ago and they thought it was due to long travel, multiple flights. States he is no longer being treated for blood clots and no new DVT.  States her MGM also had a history of stroke after cardiac surgery.   Reviewed allergies, medications, past medical, surgical, family, and social history.   Review of Systems Pertinent positives and negatives in the history of present illness.     Objective:   Physical Exam  Constitutional: She is oriented to person, place, and time. She appears well-developed and well-nourished. No distress.  Eyes: Pupils are equal, round, and reactive to light. Conjunctivae are normal.  Neck: Normal range of motion. Neck supple.  Cardiovascular: Normal rate and regular rhythm.   Pulmonary/Chest: Effort normal and breath sounds normal.  Musculoskeletal:       Right knee: Normal.       Right ankle: Normal.       Lumbar back: Normal.       Right lower leg: She exhibits no tenderness and no edema.  Normal color, sensation, pulses, brisk cap refill. No edema or tenderness. Normal ROM and strength.  Negative Homan's sign.   Neurological: She is alert and oriented to person, place, and time. She has normal strength. Gait normal.  Skin: Skin is warm and dry. No rash noted. No erythema. No pallor.  Psychiatric: She has a normal mood and affect. Her behavior is normal. Thought content normal.   BP 110/70   Pulse 98   Temp 98.2 F (36.8 C) (Oral)   Resp 16   Wt 207 lb 9.6 oz (94.2 kg)   SpO2 98%   BMI 34.55 kg/m      Assessment & Plan:  Right calf pain  Discussed that her risk for DVT is low based on Wells criteria. Discussed options and offered to send her for a doppler or we can treat her with an anti-inflammatory and see if her symptoms improve. She would like to hold off on [place] at this point and will call if she has any new or worsening symptoms.  Discussed s/sx of DVT or PE and when to seek immediate medical attention.

## 2017-10-20 ENCOUNTER — Encounter: Payer: Self-pay | Admitting: Family Medicine

## 2017-10-20 ENCOUNTER — Ambulatory Visit (INDEPENDENT_AMBULATORY_CARE_PROVIDER_SITE_OTHER): Payer: BLUE CROSS/BLUE SHIELD | Admitting: Family Medicine

## 2017-10-20 VITALS — BP 130/90 | HR 64 | Wt 211.6 lb

## 2017-10-20 DIAGNOSIS — G5603 Carpal tunnel syndrome, bilateral upper limbs: Secondary | ICD-10-CM | POA: Diagnosis not present

## 2017-10-20 DIAGNOSIS — R202 Paresthesia of skin: Secondary | ICD-10-CM | POA: Diagnosis not present

## 2017-10-20 DIAGNOSIS — R200 Anesthesia of skin: Secondary | ICD-10-CM | POA: Diagnosis not present

## 2017-10-20 DIAGNOSIS — M25532 Pain in left wrist: Secondary | ICD-10-CM

## 2017-10-20 NOTE — Progress Notes (Signed)
   Subjective:    Patient ID: Ana [name], female    DOB: May 01, 1986, 31 y.o.   MRN: [id number]  HPI Chief Complaint  Patient presents with  . hand numbness    hand numbness- both hands. pressure in wrists- been going on a while.    She is a left hand dominant female who is here with complaints of a several week history of bilateral wrist pain and progressively worsening 2 week history of pressure sensation to her left wrist along with numbness and tingling to her fingers and lateral forearms, mainly on her left arm.  States pain is worse with her left hand while grasping her steering wheel and writing. She is in school now and is writing often. 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  States she often wakes up at night with her left hand and forearm asleep.  Denies weakness. No other joint pain. No morning stiffness.   She has tried ibuprofen occasionally.   Denies fever, chills, N/V/D.   Reviewed allergies, medications, past medical, surgical, and social history.    Review of Systems Pertinent positives and negatives in the history of present illness.     Objective:   Physical Exam  Constitutional: She appears well-developed and well-nourished. No distress.  Neck: Normal range of motion. Neck supple.  Musculoskeletal:       Left shoulder: Normal.       Left elbow: Normal.       Left wrist: She exhibits tenderness. She exhibits normal range of motion and no swelling.       Cervical back: Normal.  Normal sensation and motor function to bilateral upper extremities.  Equal grips. No thenar atrophy.  Positive Tinel's, Phalen's and hand elevation test.    BP 130/90   Pulse 64   Wt 211 lb 9.6 oz (96 kg)   BMI 35.21 kg/m       Assessment & Plan:  Numbness and tingling in both hands  Wrist pain, acute, left  Bilateral carpal tunnel syndrome  Discussed that her symptoms appear to be related to carpal tunnel. No sign of any  inflammatory condition. Recommend conservative treatment with NSAIDs and wrist splints. Recommend she avoid repetitive movements when possible and try to keep her wrists in neutral position. Will have her follow up in 2 weeks. Next step will be to do nerve conduction testing or referral to hand specialist if not improving.

## 2017-10-20 NOTE — Patient Instructions (Addendum)
Take 2 Aleve twice daily with food. Wear wrist splints at nighttime and during the day when you can and try to keep your wrists in neutral position as much as possible.  Return in 2 weeks.     Carpal Tunnel Syndrome Carpal tunnel syndrome is a condition that causes pain in your hand and arm. The carpal tunnel is a narrow area located on the palm side of your wrist. Repeated wrist motion or certain diseases may cause swelling within the tunnel. This swelling pinches the main nerve in the wrist (median nerve). What are the causes? This condition may be caused by:  Repeated wrist motions.  Wrist injuries.  Arthritis.  A cyst or tumor in the carpal tunnel.  Fluid buildup during pregnancy.  Sometimes the cause of this condition is not known. What increases the risk? This condition is more likely to develop in:  People who have jobs that cause them to repeatedly move their wrists in the same motion, such as Art gallery manager.  Women.  People with certain conditions, such as: ? Diabetes. ? Obesity. ? An underactive thyroid (hypothyroidism). ? Kidney failure.  What are the signs or symptoms? Symptoms of this condition include:  A tingling feeling in your fingers, especially in your thumb, index, and middle fingers.  Tingling or numbness in your hand.  An aching feeling in your entire arm, especially when your wrist and elbow are bent for long periods of time.  Wrist pain that goes up your arm to your shoulder.  Pain that goes down into your palm or fingers.  A weak feeling in your hands. You may have trouble grabbing and holding items.  Your symptoms may feel worse during the night. How is this diagnosed? This condition is diagnosed with a medical history and physical exam. You may also have tests, including:  An electromyogram (EMG). This test measures electrical signals sent by your nerves into the muscles.  X-rays.  How is this treated? Treatment for this  condition includes:  Lifestyle changes. It is important to stop doing or modify the activity that caused your condition.  Physical or occupational therapy.  Medicines for pain and inflammation. This may include medicine that is injected into your wrist.  A wrist splint.  Surgery.  Follow these instructions at home: If you have a splint:  Wear it as told by your health care provider. Remove it only as told by your health care provider.  Loosen the splint if your fingers become numb and tingle, or if they turn cold and blue.  Keep the splint clean and dry. General instructions  Take over-the-counter and prescription medicines only as told by your health care provider.  Rest your wrist from any activity that may be causing your pain. If your condition is work related, talk to your employer about changes that can be made, such as getting a wrist pad to use while typing.  If directed, apply ice to the painful area: ? Put ice in a plastic bag. ? Place a towel between your skin and the bag. ? Leave the ice on for 20 minutes, 2-3 times per day.  Keep all follow-up visits as told by your health care provider. This is important.  Do any exercises as told by your health care provider, physical therapist, or occupational therapist. Contact a health care provider if:  You have new symptoms.  Your pain is not controlled with medicines.  Your symptoms get worse. This information is not intended to replace advice  given to you by your health care provider. Make sure you discuss any questions you have with your health care provider. Document Released: 11/22/2000 Document Revised: 04/04/2016 Document Reviewed: 04/12/2015 Elsevier Interactive Patient Education  2017 [employer].

## 2018-01-26 ENCOUNTER — Encounter: Payer: Self-pay | Admitting: Family Medicine

## 2018-01-26 ENCOUNTER — Ambulatory Visit
Admission: RE | Admit: 2018-01-26 | Discharge: 2018-01-26 | Disposition: A | Discharge status: Home / Self Care | Payer: BLUE CROSS/BLUE SHIELD | Source: Ambulatory Visit | Attending: Family Medicine | Admitting: Family Medicine

## 2018-01-26 ENCOUNTER — Ambulatory Visit (INDEPENDENT_AMBULATORY_CARE_PROVIDER_SITE_OTHER): Payer: BLUE CROSS/BLUE SHIELD | Admitting: Family Medicine

## 2018-01-26 VITALS — BP 130/90 | HR 74 | Temp 98.1°F | Wt 216.4 lb

## 2018-01-26 DIAGNOSIS — R1915 Other abnormal bowel sounds: Secondary | ICD-10-CM | POA: Diagnosis not present

## 2018-01-26 DIAGNOSIS — R1084 Generalized abdominal pain: Secondary | ICD-10-CM

## 2018-01-26 LAB — POCT URINALYSIS DIP (PROADVANTAGE DEVICE)
BILIRUBIN UA: NEGATIVE
Blood, UA: NEGATIVE
Glucose, UA: NEGATIVE mg/dL
Ketones, POC UA: NEGATIVE mg/dL
Leukocytes, UA: NEGATIVE
NITRITE UA: NEGATIVE
PH UA: 6 (ref 5.0–8.0)
Protein Ur, POC: NEGATIVE mg/dL
SPECIFIC GRAVITY, URINE: 1.015
UUROB: NEGATIVE

## 2018-01-26 NOTE — Progress Notes (Signed)
   Subjective:    Patient ID: Ana [name], female    DOB: May 05, 1986, 32 y.o.   MRN: [id number]  HPI Chief Complaint  Patient presents with  . stomach burning    stomach burning feels like its on fire. had something over the weekend. no appetite and some diarrhea   She is here with complaints of diffuse abdominal pain for the past 3 days. Pain is non radiation and feels like a combination of cramping, "spasm" and burning at times. States it does not feel like reflux.  Reports having one episode of diarrhea over the weekend and a normal bowel movement this morning. Denies blood or pus. No fever, chills, chest pain, back pain, vomiting, urinary symptoms, vaginal discharge.  States she drank alcohol on Friday prior to onset of symptoms.   IUD and female partner.   Reviewed allergies, medications, past medical, surgical, family, and social history.    Review of Systems Pertinent positives and negatives in the history of present illness.     Objective:   Physical Exam  Constitutional: She is oriented to person, place, and time. She appears well-developed and well-nourished. No distress.  HENT:  Mouth/Throat: Oropharynx is clear and moist.  Eyes: Conjunctivae are normal.  Neck: Normal range of motion. Neck supple.  Cardiovascular: Normal rate, regular rhythm, normal heart sounds and intact distal pulses. Exam reveals no gallop and no friction rub.  No murmur heard. Pulmonary/Chest: Effort normal and breath sounds normal.  Abdominal: Soft. Normal appearance. Bowel sounds are decreased. There is no hepatosplenomegaly. There is tenderness in the right lower quadrant and left lower quadrant. There is no rigidity, no rebound, no guarding, no CVA tenderness, no tenderness at McBurney's point and negative Murphy's sign.  Lymphadenopathy:    She has no cervical adenopathy.  Neurological: She is alert and oriented to person, place, and time. She has normal strength. No cranial nerve deficit or  sensory deficit.  Skin: Skin is warm and dry. No rash noted. No pallor.   BP 130/90   Pulse 74   Temp 98.1 F (36.7 C) (Oral)   Wt 216 lb 6.4 oz (98.2 kg)   BMI 36.01 kg/m       Assessment & Plan:  Generalized abdominal pain - Plan: CBC with Differential/Platelet, Comprehensive metabolic panel, POCT Urinalysis DIP (Proadvantage Device), Lipase, DG Abd 1 View  Decreased bowel sounds - Plan: DG Abd 1 View  Discussed possible etiologies including constipation or gastroenteritis. Highly unlikely to be appendicitis, gallbladder related.  UA - negative  Check labs including CBC, CMP, lipase.  XR KUB ordered.  Samples of probiotic given. She may also try a H2 blocker or PPI if she wishes.  Follow up pending labs and XR.  She will keep in close contact in regards to any new or worsening symptoms.

## 2018-01-27 ENCOUNTER — Encounter: Payer: Self-pay | Admitting: Family Medicine

## 2018-01-27 ENCOUNTER — Other Ambulatory Visit: Payer: Self-pay | Admitting: Family Medicine

## 2018-01-27 DIAGNOSIS — R7401 Elevation of levels of liver transaminase levels: Secondary | ICD-10-CM

## 2018-01-27 DIAGNOSIS — R109 Unspecified abdominal pain: Secondary | ICD-10-CM

## 2018-01-27 DIAGNOSIS — R740 Nonspecific elevation of levels of transaminase and lactic acid dehydrogenase [LDH]: Secondary | ICD-10-CM

## 2018-01-27 DIAGNOSIS — R748 Abnormal levels of other serum enzymes: Secondary | ICD-10-CM

## 2018-01-27 HISTORY — DX: Elevation of levels of liver transaminase levels: R74.01

## 2018-01-27 LAB — COMPREHENSIVE METABOLIC PANEL
ALK PHOS: 53 IU/L (ref 39–117)
ALT: 45 IU/L — AB (ref 0–32)
AST: 31 IU/L (ref 0–40)
Albumin/Globulin Ratio: 1.6 (ref 1.2–2.2)
Albumin: 4.5 g/dL (ref 3.5–5.5)
BILIRUBIN TOTAL: 0.7 mg/dL (ref 0.0–1.2)
BUN/Creatinine Ratio: 9 (ref 9–23)
BUN: 7 mg/dL (ref 6–20)
CHLORIDE: 104 mmol/L (ref 96–106)
CO2: 22 mmol/L (ref 20–29)
Calcium: 9.7 mg/dL (ref 8.7–10.2)
Creatinine, Ser: 0.8 mg/dL (ref 0.57–1.00)
GFR calc Af Amer: 114 mL/min/{1.73_m2} (ref >59)
GFR calc non Af Amer: 99 mL/min/{1.73_m2} (ref >59)
Globulin, Total: 2.9 g/dL (ref 1.5–4.5)
Glucose: 109 mg/dL — ABNORMAL HIGH (ref 65–99)
POTASSIUM: 3.9 mmol/L (ref 3.5–5.2)
Sodium: 141 mmol/L (ref 134–144)
Total Protein: 7.4 g/dL (ref 6.0–8.5)

## 2018-01-27 LAB — CBC WITH DIFFERENTIAL/PLATELET
BASOS: 0 %
Basophils Absolute: 0 10*3/uL (ref 0.0–0.2)
EOS (ABSOLUTE): 0.2 10*3/uL (ref 0.0–0.4)
Eos: 3 %
Hematocrit: 44.4 % (ref 34.0–46.6)
Hemoglobin: 15.5 g/dL (ref 11.1–15.9)
IMMATURE GRANULOCYTES: 0 %
Immature Grans (Abs): 0 10*3/uL (ref 0.0–0.1)
Lymphocytes Absolute: 2.8 10*3/uL (ref 0.7–3.1)
Lymphs: 34 %
MCH: 29.7 pg (ref 26.6–33.0)
MCHC: 34.9 g/dL (ref 31.5–35.7)
MCV: 85 fL (ref 79–97)
MONOS ABS: 0.4 10*3/uL (ref 0.1–0.9)
Monocytes: 4 %
NEUTROS ABS: 5 10*3/uL (ref 1.4–7.0)
NEUTROS PCT: 59 %
PLATELETS: 248 10*3/uL (ref 150–379)
RBC: 5.22 x10E6/uL (ref 3.77–5.28)
RDW: 13.4 % (ref 12.3–15.4)
WBC: 8.4 10*3/uL (ref 3.4–10.8)

## 2018-01-27 LAB — LIPASE: LIPASE: 38 U/L (ref 14–72)

## 2018-02-02 ENCOUNTER — Ambulatory Visit
Admission: RE | Admit: 2018-02-02 | Discharge: 2018-02-02 | Disposition: A | Discharge status: Home / Self Care | Payer: BLUE CROSS/BLUE SHIELD | Source: Ambulatory Visit | Attending: Family Medicine | Admitting: Family Medicine

## 2018-02-02 ENCOUNTER — Encounter: Payer: Self-pay | Admitting: Family Medicine

## 2018-02-02 DIAGNOSIS — R748 Abnormal levels of other serum enzymes: Secondary | ICD-10-CM

## 2018-02-02 DIAGNOSIS — K760 Fatty (change of) liver, not elsewhere classified: Secondary | ICD-10-CM

## 2018-02-02 DIAGNOSIS — R109 Unspecified abdominal pain: Secondary | ICD-10-CM

## 2018-02-02 HISTORY — DX: Fatty (change of) liver, not elsewhere classified: K76.0

## 2018-02-28 IMAGING — US US ABDOMEN COMPLETE
1 series · 14 of 25 positions shown · non-contrast
Comparison: None.

CLINICAL DATA: Elevated liver function tests. Abdomen pain for 1
week.

EXAM:
ABDOMEN ULTRASOUND COMPLETE

[Series 1: us abdomen complete · 0.28mm/px · 14 of 91 slices shown]
[im 1/91]
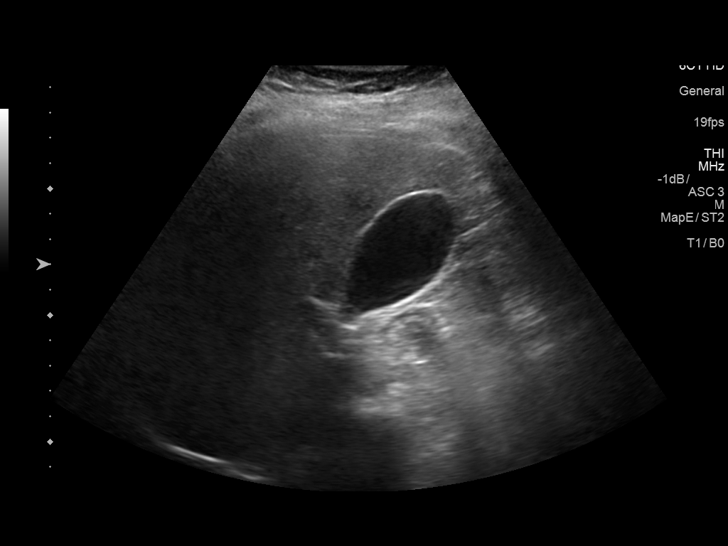
[im 8/91]
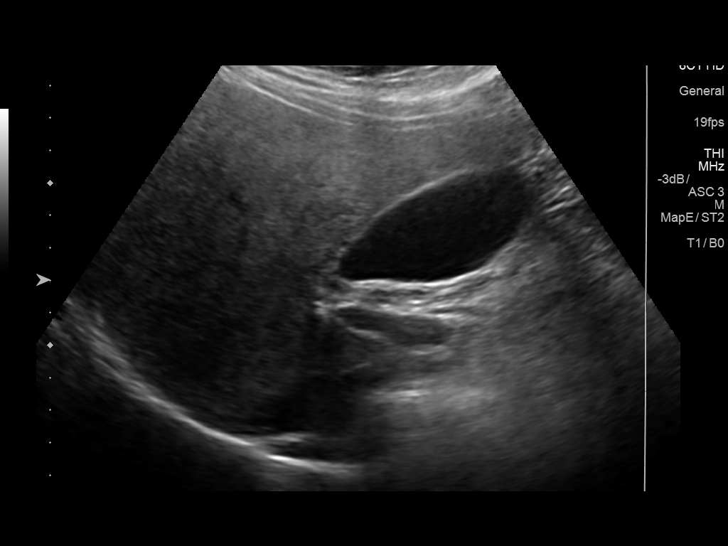
[im 16/91]
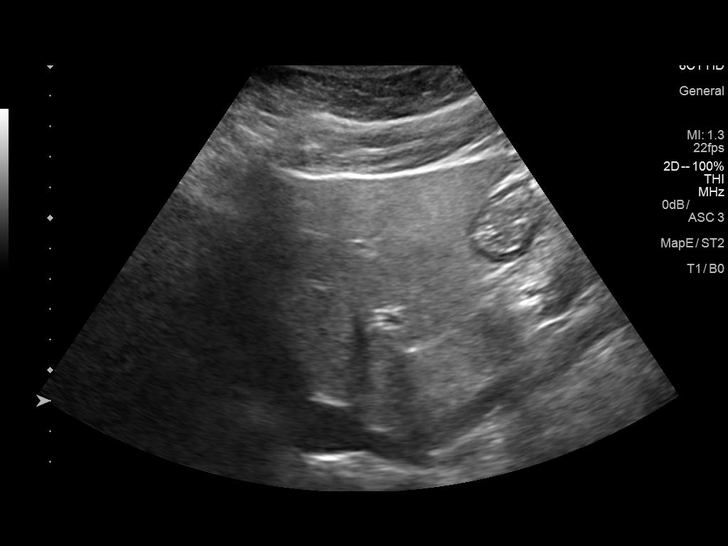
[im 23/91]
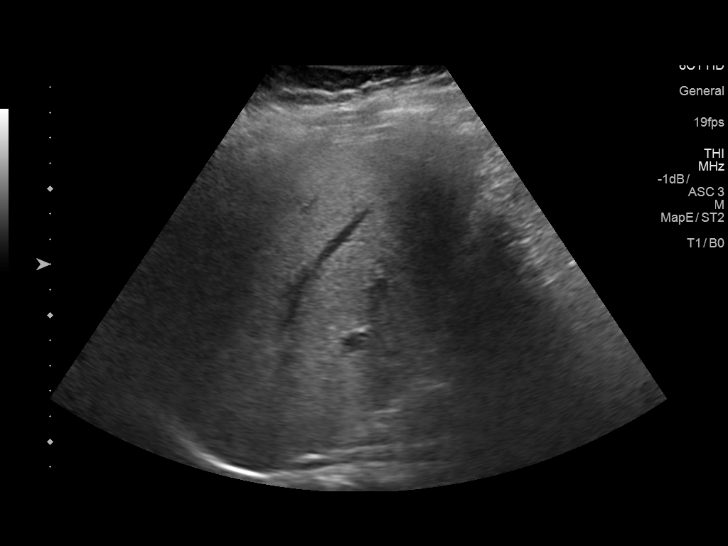
[im 31/91]
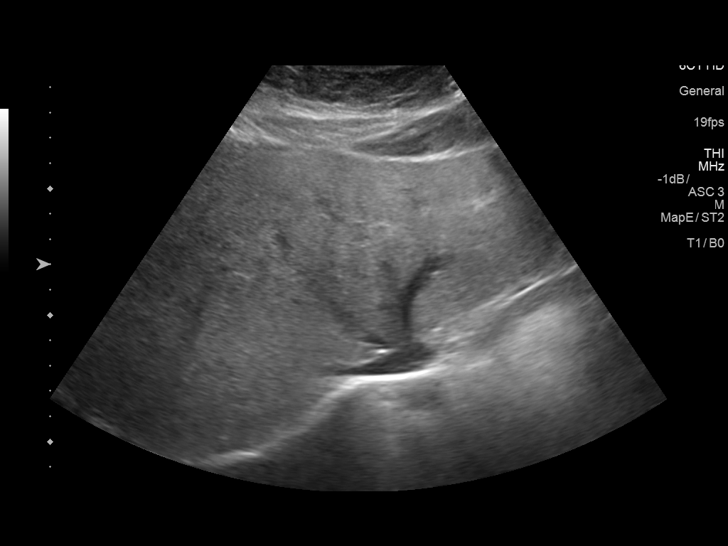
[im 34/91]
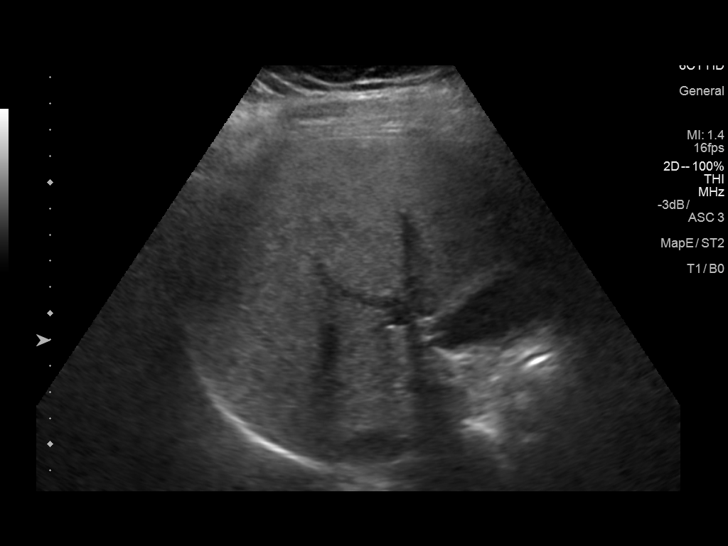
[im 42/91]
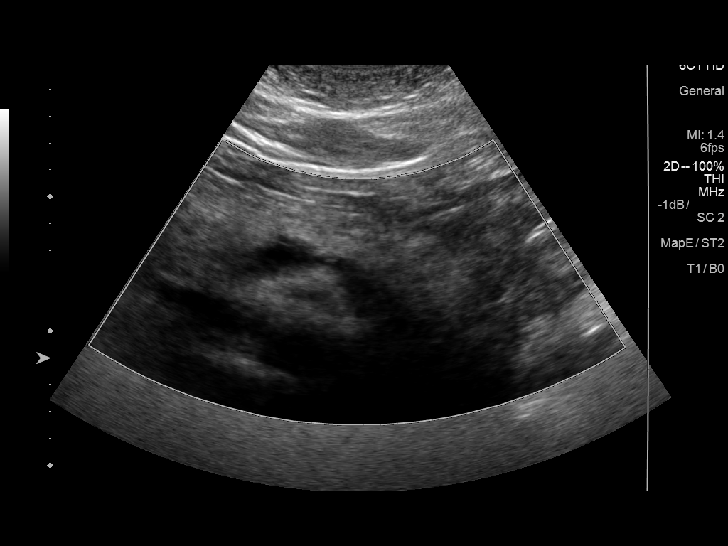
[im 49/91]
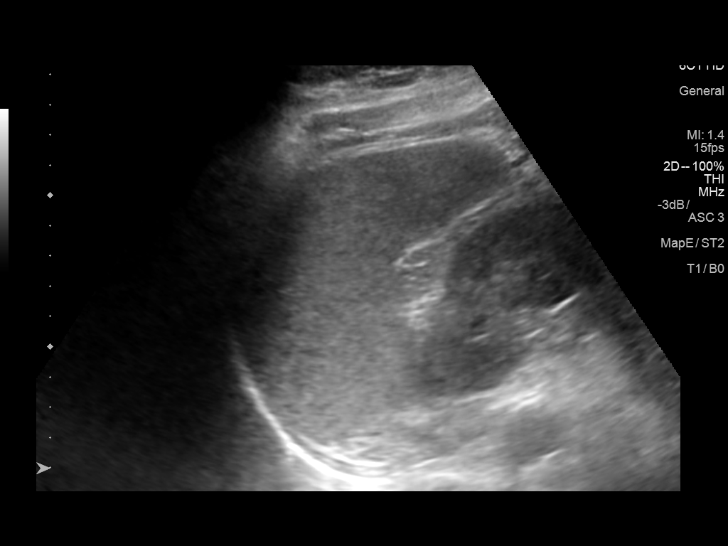
[im 57/91]
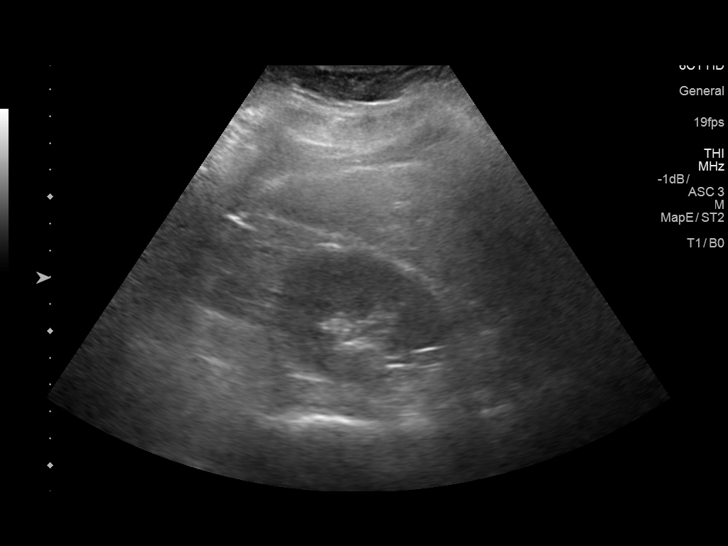
[im 61/91]
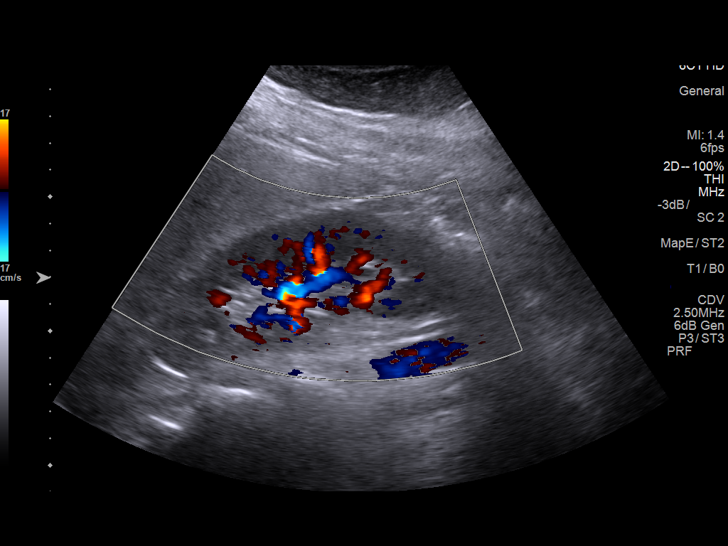
[im 68/91]
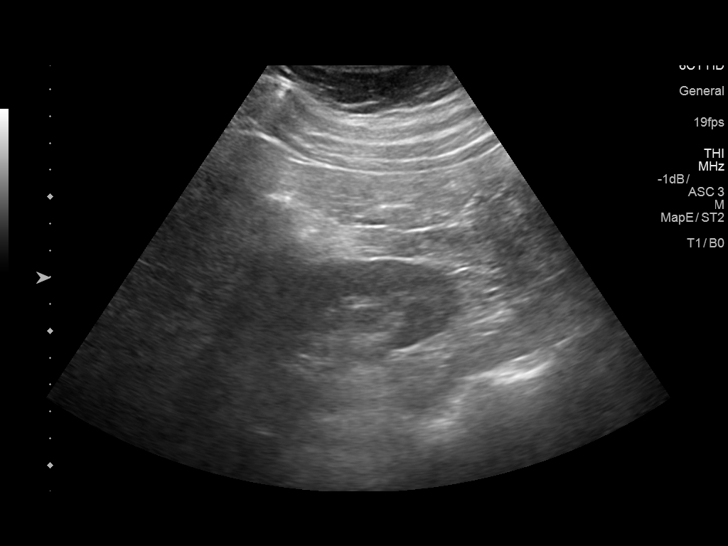
[im 76/91]
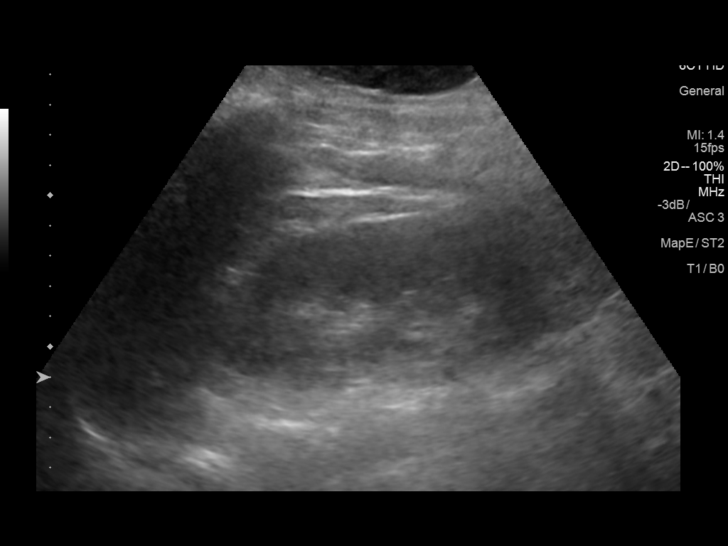
[im 83/91]
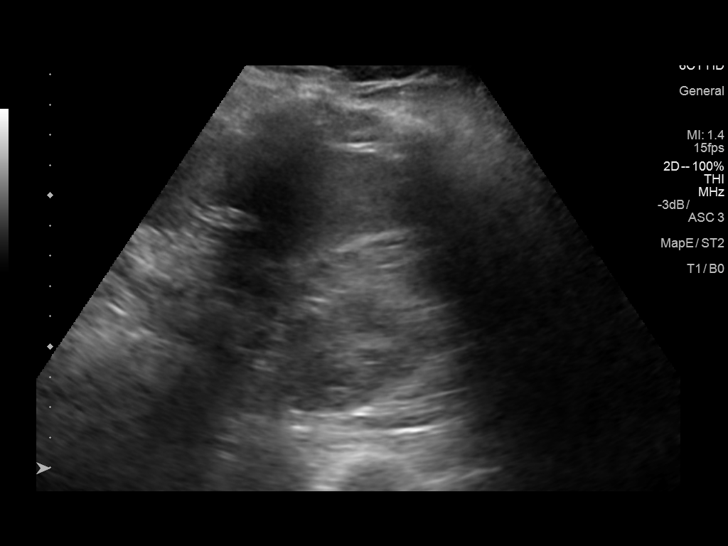
[im 91/91]
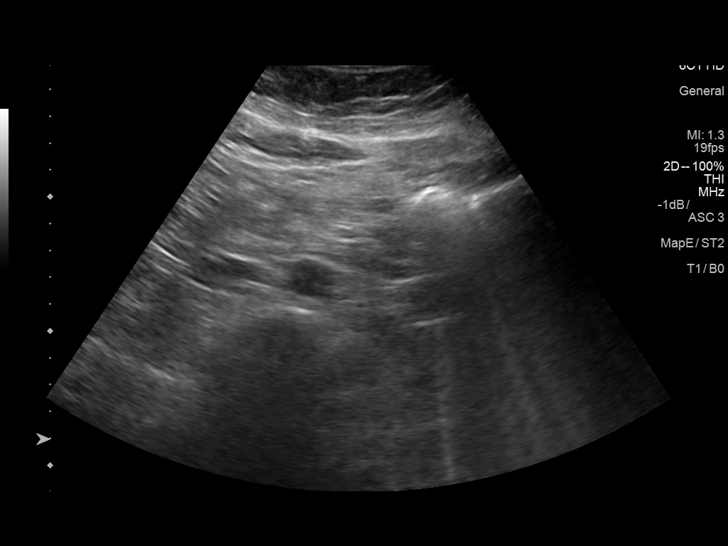

[14 of 25 positions shown; findings below may reference images not displayed]

FINDINGS: Gallbladder: No gallstones or wall thickening visualized. No
sonographic Murphy sign noted by sonographer.

Common bile duct: Diameter: 2 mm

Liver: No focal lesion identified. There is diffuse increased
echotexture of the liver. Portal vein is patent on color Doppler
imaging with normal direction of blood flow towards the liver.

IVC: No abnormality visualized.

Pancreas: Limited visualization due to overlying bowel gas.

Spleen: Size and appearance within normal limits.

Right Kidney: Length: 11.5 cm. Echogenicity within normal limits. No
mass or hydronephrosis visualized.

Left Kidney: Length: 11.6 cm. Echogenicity within normal limits. No
mass or hydronephrosis visualized.

Abdominal aorta: No aneurysm visualized.

Other findings: None.
IMPRESSION: Fatty infiltration of the liver. No acute abnormality identified in
the abdomen.

## 2018-06-29 ENCOUNTER — Ambulatory Visit: Payer: BLUE CROSS/BLUE SHIELD | Admitting: Obstetrics and Gynecology

## 2018-06-30 NOTE — Progress Notes (Signed)
32 y.o. G0P0 SingleCaucasianF here for annual exam.  She has a mirnea IUD, inserted in 8/18. H/O PCOS and very heavy bleeding prior to the IUD. She needed a blood transfusions and D&C in 2012-13. No bleeding.  Gender identity is female. She c/o intermittent random vaginal pain that can radiate to her rectum. The pain is sharp and crampy feeling. Can last for 30 seconds to 5 minutes. It has been happening for a couple of months, occurs every couple of weeks.  No discomfort with sexual activity, no vaginal penetration.  No bowel or bladder c/o.  Moving to [place]. 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  Fiance is going with her, female.  She c/o a 2 week h/o bilateral breast and nipple pain.    No LMP recorded. (Menstrual status: IUD).          Sexually active: Yes.    The current method of family planning is IUD.    Exercising: Yes.    walking, hiking Smoker:  Yes, 1/2 PPD. Plans to quit.   Health Maintenance: Pap:  04/01/16 Neg              07/06/12 Neg  History of abnormal Pap:  no MMG:  never TDaP:  06/2014  Gardasil: None   reports that she has been smoking cigarettes.  She has a 1.50 pack-year smoking history. She has never used smokeless tobacco. She reports that she does not drink alcohol or use drugs. Rare ETOH  Past Medical History:  Diagnosis Date  . ADHD 2018   Rachel Moulds   . Anemia   . Blood transfusion    May 2012  . Blood transfusion without reported diagnosis   . Elevated ALT measurement 01/27/2018  . Fatty liver 02/02/2018  . Lipids abnormal   . Pneumonia   . Prediabetes     History reviewed. No pertinent surgical history.  Current Outpatient Medications  Medication Sig Dispense Refill  . levonorgestrel (MIRENA) 20 MCG/24HR IUD 1 each by Intrauterine route once.    Marland Kitchen VYVANSE 20 MG capsule      No current facility-administered medications for this visit.     Family History  Problem Relation Age  of Onset  . Hashimoto's thyroiditis Mother   . Depression Mother   . Alcohol abuse Mother   . Sleep apnea Father   . Deep vein thrombosis Father   . Hypertension Paternal Aunt   . Heart Problems Maternal Grandmother   . Stroke Maternal Grandmother   . Diabetes Maternal Grandfather     Review of Systems  Constitutional: Negative.   HENT: Negative.   Eyes: Negative.   Respiratory: Negative.   Cardiovascular: Negative.   Gastrointestinal: Negative.   Endocrine: Negative.   Genitourinary: Positive for vaginal pain.  Musculoskeletal: Negative.   Skin: Negative.   Allergic/Immunologic: Negative.   Neurological: Negative.   Hematological: Negative.   Psychiatric/Behavioral: Negative.     Exam:   BP 118/80 (BP Location: Right Arm, Patient Position: Sitting)   Pulse 80   Ht 5\' 5"  (1.651 m)   Wt 210 lb 12.8 oz (95.6 kg)   BMI 35.08 kg/m   Weight change: @WEIGHTCHANGE @ Height:   Height: 5\' 5"  (165.1 cm)  Ht Readings from Last 3 Encounters:  07/02/18 5\' 5"  (1.651 m)  07/03/17 5\' 5"  (1.651 m)  06/19/17 5\' 5"  (1.651 m)    General appearance: alert, cooperative and appears stated age Head: Normocephalic, without  obvious abnormality, atraumatic Neck: no adenopathy, supple, symmetrical, trachea midline and thyroid normal to inspection and palpation Lungs: clear to auscultation bilaterally Cardiovascular: regular rate and rhythm Breasts: normal appearance, no masses or tenderness Abdomen: soft, non-tender; non distended,  no masses,  no organomegaly Extremities: extremities normal, atraumatic, no cyanosis or edema Skin: Skin color, texture, turgor normal. No rashes or lesions Lymph nodes: Cervical, supraclavicular, and axillary nodes normal. No abnormal inguinal nodes palpated Neurologic: Grossly normal   Pelvic: External genitalia:  no lesions              Urethra:  normal appearing urethra with no masses, tenderness or lesions              Bartholins and Skenes: normal                  Vagina: normal appearing vagina with normal color and discharge, no lesions              Cervix: no lesions and IUD string 1-2 cm               Bimanual Exam:  Uterus:  normal size, contour, position, consistency, mobility, non-tender and anteverted              Adnexa: no mass, fullness, tenderness               Rectovaginal: Confirms               Anus:  normal sphincter tone, no lesions  Chaperone was present for exam.  A:  Well Woman with normal exam  Intermittent vaginal pain, normal exam, suspect muscle spasms  IUD check, doing well  Mastalgia, suspect hormonal, management discussed  P:   No pap this year  Screening labs (mildly elevated LFT and fatty liver on U/S earlier this year)  If her vaginal pain continues, consider evaluation with PT  Discussed breast self awareness

## 2018-07-02 ENCOUNTER — Other Ambulatory Visit: Payer: Self-pay

## 2018-07-02 ENCOUNTER — Ambulatory Visit (INDEPENDENT_AMBULATORY_CARE_PROVIDER_SITE_OTHER): Payer: BLUE CROSS/BLUE SHIELD | Admitting: Obstetrics and Gynecology

## 2018-07-02 ENCOUNTER — Encounter: Payer: Self-pay | Admitting: Obstetrics and Gynecology

## 2018-07-02 VITALS — BP 118/80 | HR 80 | Ht 65.0 in | Wt 210.8 lb

## 2018-07-02 DIAGNOSIS — N644 Mastodynia: Secondary | ICD-10-CM

## 2018-07-02 DIAGNOSIS — Z0000 Encounter for general adult medical examination without abnormal findings: Secondary | ICD-10-CM | POA: Diagnosis not present

## 2018-07-02 DIAGNOSIS — Z01419 Encounter for gynecological examination (general) (routine) without abnormal findings: Secondary | ICD-10-CM | POA: Diagnosis not present

## 2018-07-02 DIAGNOSIS — Z6835 Body mass index (BMI) 35.0-35.9, adult: Secondary | ICD-10-CM

## 2018-07-02 DIAGNOSIS — R102 Pelvic and perineal pain: Secondary | ICD-10-CM

## 2018-07-02 NOTE — Patient Instructions (Signed)
To try and decrease your breast pain, you should have a well fitting supportive bra, cut back on caffeine, and use ice or heat as needed. Some women find relief with the supplement evening primrose oil.  EXERCISE AND DIET:  We recommended that you start or continue a regular exercise program for good health. Regular exercise means any activity that makes your heart beat faster and makes you sweat.  We recommend exercising at least 30 minutes per day at least 3 days a week, preferably 4 or 5.  We also recommend a diet low in fat and sugar.  Inactivity, poor dietary choices and obesity can cause diabetes, heart attack, stroke, and kidney damage, among others.    ALCOHOL AND SMOKING:  Women should limit their alcohol intake to no more than 7 drinks/beers/glasses of wine (combined, not each!) per week. Moderation of alcohol intake to this level decreases your risk of breast cancer and liver damage. And of course, no recreational drugs are part of a healthy lifestyle.  And absolutely no smoking or even second hand smoke. Most people know smoking can cause heart and lung diseases, but did you know it also contributes to weakening of your bones? Aging of your skin?  Yellowing of your teeth and nails?  CALCIUM AND VITAMIN D:  Adequate intake of calcium and Vitamin D are recommended.  The recommendations for exact amounts of these supplements seem to change often, but generally speaking 600 mg of calcium (either carbonate or citrate) and 800 units of Vitamin D per day seems prudent. Certain women may benefit from higher intake of Vitamin D.  If you are among these women, your doctor will have told you during your visit.    PAP SMEARS:  Pap smears, to check for cervical cancer or precancers,  have traditionally been done yearly, although recent scientific advances have shown that most women can have pap smears less often.  However, every woman still should have a physical exam from her gynecologist every year. It will  include a breast check, inspection of the vulva and vagina to check for abnormal growths or skin changes, a visual exam of the cervix, and then an exam to evaluate the size and shape of the uterus and ovaries.  And after 32 years of age, a rectal exam is indicated to check for rectal cancers. We will also provide age appropriate advice regarding health maintenance, like when you should have certain vaccines, screening for sexually transmitted diseases, bone density testing, colonoscopy, mammograms, etc.   MAMMOGRAMS:  All women over 40 years old should have a yearly mammogram. Many facilities now offer a "3D" mammogram, which may cost around $50 extra out of pocket. If possible,  we recommend you accept the option to have the 3D mammogram performed.  It both reduces the number of women who will be called back for extra views which then turn out to be normal, and it is better than the routine mammogram at detecting truly abnormal areas.    COLONOSCOPY:  Colonoscopy to screen for colon cancer is recommended for all women at age 50.  We know, you hate the idea of the prep.  We agree, BUT, having colon cancer and not knowing it is worse!!  Colon cancer so often starts as a polyp that can be seen and removed at colonscopy, which can quite literally save your life!  And if your first colonoscopy is normal and you have no family history of colon cancer, most women don't have to have   it again for 10 years.  Once every ten years, you can do something that may end up saving your life, right?  We will be happy to help you get it scheduled when you are ready.  Be sure to check your insurance coverage so you understand how much it will cost.  It may be covered as a preventative service at no cost, but you should check your particular policy.      

## 2018-07-03 LAB — COMPREHENSIVE METABOLIC PANEL
ALT: 33 IU/L — AB (ref 0–32)
AST: 23 IU/L (ref 0–40)
Albumin/Globulin Ratio: 1.7 (ref 1.2–2.2)
Albumin: 4.3 g/dL (ref 3.5–5.5)
Alkaline Phosphatase: 39 IU/L (ref 39–117)
BUN/Creatinine Ratio: 12 (ref 9–23)
BUN: 9 mg/dL (ref 6–20)
Bilirubin Total: 0.7 mg/dL (ref 0.0–1.2)
CALCIUM: 9.1 mg/dL (ref 8.7–10.2)
CO2: 22 mmol/L (ref 20–29)
CREATININE: 0.77 mg/dL (ref 0.57–1.00)
Chloride: 105 mmol/L (ref 96–106)
GFR, EST AFRICAN AMERICAN: 118 mL/min/{1.73_m2} (ref >59)
GFR, EST NON AFRICAN AMERICAN: 102 mL/min/{1.73_m2} (ref >59)
GLUCOSE: 112 mg/dL — AB (ref 65–99)
Globulin, Total: 2.6 g/dL (ref 1.5–4.5)
Potassium: 4.3 mmol/L (ref 3.5–5.2)
Sodium: 142 mmol/L (ref 134–144)
TOTAL PROTEIN: 6.9 g/dL (ref 6.0–8.5)

## 2018-07-03 LAB — LIPID PANEL
CHOLESTEROL TOTAL: 145 mg/dL (ref 100–199)
Chol/HDL Ratio: 4.7 ratio — ABNORMAL HIGH (ref 0.0–4.4)
HDL: 31 mg/dL — AB (ref >39)
LDL CALC: 90 mg/dL (ref 0–99)
TRIGLYCERIDES: 119 mg/dL (ref 0–149)
VLDL CHOLESTEROL CAL: 24 mg/dL (ref 5–40)

## 2018-07-03 LAB — HEMOGLOBIN A1C
Est. average glucose Bld gHb Est-mCnc: 105 mg/dL
Hgb A1c MFr Bld: 5.3 % (ref 4.8–5.6)

## 2018-07-03 LAB — CBC
HEMATOCRIT: 47 % — AB (ref 34.0–46.6)
HEMOGLOBIN: 15.7 g/dL (ref 11.1–15.9)
MCH: 29.7 pg (ref 26.6–33.0)
MCHC: 33.4 g/dL (ref 31.5–35.7)
MCV: 89 fL (ref 79–97)
Platelets: 227 10*3/uL (ref 150–450)
RBC: 5.28 x10E6/uL (ref 3.77–5.28)
RDW: 13.8 % (ref 12.3–15.4)
WBC: 7.6 10*3/uL (ref 3.4–10.8)

## 2018-07-03 LAB — TSH: TSH: 1.56 u[IU]/mL (ref 0.450–4.500)
